# Patient Record
Sex: Female | Born: 1995 | Race: Black or African American | Hispanic: No | Marital: Single
Health system: Midwestern US, Community
[De-identification: ages and names within clinical notes are randomized; demographics above are authoritative.]

## PROBLEM LIST (undated history)

## (undated) ENCOUNTER — Inpatient Hospital Stay (HOSPITAL_COMMUNITY): Payer: Self-pay

## (undated) DIAGNOSIS — I10 Essential (primary) hypertension: Secondary | ICD-10-CM

## (undated) DIAGNOSIS — O139 Gestational [pregnancy-induced] hypertension without significant proteinuria, unspecified trimester: Secondary | ICD-10-CM

## (undated) HISTORY — PX: NO PAST SURGERIES: SHX2092

## (undated) HISTORY — PX: WISDOM TOOTH EXTRACTION: SHX21

---
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
## 2015-08-07 ENCOUNTER — Inpatient Hospital Stay
Admit: 2015-08-07 | Discharge: 2015-08-08 | Disposition: A | Discharge status: Home / Self Care | Payer: MEDICAID | Attending: Emergency Medicine

## 2015-08-07 DIAGNOSIS — L02211 Cutaneous abscess of abdominal wall: Secondary | ICD-10-CM

## 2015-08-07 LAB — CBC WITH AUTOMATED DIFF
ABS. BASOPHILS: 0 10*3/uL (ref 0.0–0.1)
ABS. EOSINOPHILS: 0 10*3/uL (ref 0.0–0.4)
ABS. LYMPHOCYTES: 2.2 10*3/uL (ref 0.8–3.5)
ABS. MONOCYTES: 0.9 10*3/uL (ref 0.0–1.0)
ABS. NEUTROPHILS: 11.7 10*3/uL — ABNORMAL HIGH (ref 1.8–8.0)
BASOPHILS: 0 % (ref 0–1)
EOSINOPHILS: 0 % (ref 0–7)
HCT: 37.2 % (ref 35.0–47.0)
HGB: 12.2 g/dL (ref 11.5–16.0)
LYMPHOCYTES: 15 % (ref 12–49)
MCH: 25.6 PG — ABNORMAL LOW (ref 26.0–34.0)
MCHC: 32.8 g/dL (ref 30.0–36.5)
MCV: 78.2 FL — ABNORMAL LOW (ref 80.0–99.0)
MONOCYTES: 6 % (ref 5–13)
NEUTROPHILS: 79 % — ABNORMAL HIGH (ref 32–75)
PLATELET: 176 10*3/uL (ref 150–400)
RBC: 4.76 M/uL (ref 3.80–5.20)
RDW: 16 % — ABNORMAL HIGH (ref 11.5–14.5)
WBC: 14.9 10*3/uL — ABNORMAL HIGH (ref 3.6–11.0)

## 2015-08-07 LAB — HCG URINE, QL. - POC: Pregnancy test,urine (POC): NEGATIVE

## 2015-08-07 MED ORDER — LIDOCAINE (PF) 10 MG/ML (1 %) IJ SOLN
10 mg/mL (1 %) | INTRAMUSCULAR | Status: DC
Start: 2015-08-07 — End: 2015-08-07

## 2015-08-07 MED ORDER — LIDOCAINE HCL 1 % (10 MG/ML) IJ SOLN
10 mg/mL (1 %) | Freq: Once | INTRAMUSCULAR | Status: AC
Start: 2015-08-07 — End: 2015-08-07
  Administered 2015-08-07: via SUBCUTANEOUS

## 2015-08-07 MED FILL — XYLOCAINE-MPF 10 MG/ML (1 %) INJECTION SOLUTION: 10 mg/mL (1 %) | INTRAMUSCULAR | Qty: 10

## 2015-08-07 NOTE — ED Notes (Signed)
Patient's visitor given apple juice.

## 2015-08-07 NOTE — ED Notes (Signed)
Dry dressing applied to patient's right upper abdominal quadrant.

## 2015-08-07 NOTE — Other (Signed)
Axis I   Depressive disorders recurrent  Axis II   No axis II diagnosis   Axis III   General medical conditions  Axis IV  Problems with primary support group and other psychosocial and environmental problems      Axis V  70-61 Some mild symptoms (e.g., depressed mood and mild insomnia) OR some difficulty in social, occupational, or school functioning (e.g., occasional truancy, or theft within the household), but generally functioning pretty well, has some meaningful relationships.  Comprehensive Assessment Form Part 1    Section I - Disposition      The Medical Doctor to Psychiatrist conference was not completed.  The Medical Doctor is in agreement with Psychiatrist disposition because patient is not seeking admission and contracts for safety if discharged..  The plan is patient is to be discharged and [facility] and her MHSS counselor.  The on-call Psychiatrist consulted was Dr. Latanya Maudlinahlvani.  The admitting Psychiatrist will not be applicable.  The admitting Diagnosis is NA.  The Payer source is [employer].  The name of the representative was NA.  This was not approved.     Section II - Integrated Summary  Summary:  [facility] is at bedside.  89 y[facility] with a small ulcer on her abdomen. Patient reported losing her baby in late March. She admits to an attempted pill overdose in 2015 that resulted in an admission to [facility]. Medical issues include obesity and HTN. [facility]. Patient is accompained by her "sister".  The patient is deemed competent to provide informed consent.  The information is given by the patient, relative(s) and past medical records.  The Chief Complaint is depression.  The Precipitant Factors are personal stressors.  Previous Hospitalizations: 2015  The patient has not previously been in restraints.  Current Psychiatrist and/or Case Manager is [facility], [facility] & private skills building counselor.    Lethality Assessment:     The potential for suicide is noted by the following: not noted.  The potential for homicide is not noted.  The patient has not been a perpetrator of sexual or physical abuse.  There are not pending charges.  The patient is not felt to be at risk for self harm or harm to others.  The attending nurse was advised that security has not been notified.    Section III - Psychosocial  The patient's overall mood and attitude is depressed.  Feelings of helplessness and hopelessness are not observed.  Generalized anxiety is observed by patient's report.  Panic is not observed. Phobias are not observed.  Obsessive compulsive tendencies are not observed.      Section IV - Mental Status Exam  The patient's appearance shows no evidence of impairment.  The patient's behavior is guarded. The patient is oriented to time, place, person and situation.  The patient's speech is soft.  The patient's mood  is depressed.  The range of affect shows no evidence of impairment.  The patient's thought content  demonstrates no evidence of impairment.  The thought process is circumstantial.  The patient's perception shows no evidence of impairment. The patient's memory shows no evidence of impairment.  The patient's appetite shows no evidence of impairment.  The patient's sleep shows no evidence of impairment. The patient shows little insight.  The patient's judgement is psychologically impaired.        Section V - Substance Abuse  The patient is using substances.  The patient is using  tobacco by inhalation for 1-5 years with last use on 08/07/15. The patient has not   experienced withdrawal symptoms.    Section VI - Living Arrangements  The patient is single.  The patient lives alone. The patient has no children.  The patient does plan to return home upon discharge.  The patient does not have legal issues pending. The patient's source of income comes from family.  Religious and cultural practices have not been voiced at this time.     The patient's greatest support comes from friends and this person will be involved with the treatment.    The patient has not been in an event described as horrible or outside the realm of ordinary life experience either currently or in the past.  The patient has not been a victim of sexual/physical abuse.    Section VII - Other Areas of Clinical Concern  The highest grade achieved is 12 th with the overall quality of school experience being described as good.  The patient is currently  unemployed and speaks [place] as a primary language.  The patient has no communication impairments affecting communication. The patient's preference for learning can be described as: can read and write adequately.  The patient's hearing is normal.  The patient's vision is normal .  Patient contracts for safety if discharged.    Claudie Leach

## 2015-08-07 NOTE — ED Notes (Signed)
Bedside and Verbal shift change report given to Philippa ChesterBrittney, Charity fundraiserN (Cabin crewoncoming nurse) by Preston FleetingHeather RN (offgoing nurse). Report included the following information SBAR.

## 2015-08-07 NOTE — ED Notes (Signed)
Assumed care of patient from waiting room.  Patient c/o abscess to right upper abdomen x 3 days, denies fever.      During psych assessment patient reported depression and not having medicaid to pay for her prescriptions, reports admission in 2015 to [facility] for same.  Denies plan, interested in resources.  Provider notified.  Patient identified support person at bedside.      Emergency Department Nursing Plan of Care       The Nursing Plan of Care is developed from the Nursing assessment and Emergency Department Attending provider initial evaluation.  The plan of care may be reviewed in the ???ED Provider note???.    The Plan of Care was developed with the following considerations:   Patient / Family readiness to learn indicated UJ:[id number]:verbalized understanding  Persons(s) to be included in education: patient  Barriers to Learning/Limitations:No    Signed     Orson ApeHeather H Mills, RN    08/07/2015   6:46 PM

## 2015-08-07 NOTE — ED Notes (Signed)
[facility] ([facility])  phoned in regards to consult. Annette StableBill informs he will be here shortly

## 2015-08-07 NOTE — ED Provider Notes (Signed)
HPI Comments: Pt reports had a miscarriage about a month and a half ago. She has a hx depression and is currently taking Zoloft. Pt reports zoloft is not helping and she is feeling increased sadness recently. Denies SI or HI. Pt expressed to friend about a month ago that she wanted to hurt herself but she did not have a plan. Pt was previously admitted to [facility] for depression two years ago. Pt sees a counselor multiple times weekly. Pt reports she has been sleeping less and has a decreased appetite.    Patient is a 20 y.o. female presenting with skin problem. The history is provided by the patient.   Skin Problem    This is a new problem. Episode onset: Pt reports redness, pain and swelling to RUQ x 3 days. Denies trauma/injury, draiange, warmth. No hx of abscesses. There has been no fever. Affected Location: RUQ. The pain is at a severity of 8/10. The pain is severe. Associated symptoms include pain. Pertinent negatives include no blisters, no itching, no weeping and no hives. She has tried nothing for the symptoms.        Past Medical History:   Diagnosis Date   ??? Psychiatric disorder        No past surgical history on file.      No family history on file.    Social History     Social History   ??? Marital status: SINGLE     Spouse name: N/A   ??? Number of children: N/A   ??? Years of education: N/A     Occupational History   ??? Not on file.     Social History Main Topics   ??? Smoking status: Current Every Day Smoker     Packs/day: 0.25   ??? Smokeless tobacco: Never Used   ??? Alcohol use Yes   ??? Drug use: Not on file   ??? Sexual activity: Yes     Partners: Male     Birth control/ protection: Injection     Other Topics Concern   ??? Not on file     Social History Narrative   ??? No narrative on file         ALLERGIES: Review of patient's allergies indicates no known allergies.    Review of Systems   Constitutional: Negative for chills and fever.   Respiratory: Negative for chest tightness and shortness of breath.     Cardiovascular: Negative for chest pain.   Gastrointestinal: Negative for abdominal pain, nausea and vomiting.   Genitourinary: Negative for flank pain.   Musculoskeletal: Negative for back pain and myalgias.   Skin: Positive for color change and rash. Negative for itching, pallor and wound.   Neurological: Negative for dizziness, weakness and light-headedness.   Psychiatric/Behavioral: Positive for sleep disturbance. Negative for agitation, behavioral problems, confusion, decreased concentration, dysphoric mood, hallucinations, self-injury and suicidal ideas. The patient is not nervous/anxious and is not hyperactive.    All other systems reviewed and are negative.      Vitals:    08/07/15 1834   BP: 142/68   Pulse: (!) 106   Resp: 18   Temp: 98.8 ??F (37.1 ??C)   SpO2: 100%   Weight: 107.5 kg (237 lb)   Height: 5\' 3"  (1.6 m)            Physical Exam   Constitutional: She is oriented to person, place, and time. She appears well-developed and well-nourished. No distress.   HENT:   Head: Normocephalic  and atraumatic.   Eyes: Conjunctivae are normal.   Cardiovascular: Normal rate, regular rhythm and normal heart sounds.    Pulmonary/Chest: Effort normal and breath sounds normal. No respiratory distress.   Abdominal: Soft. Normal appearance and bowel sounds are normal. She exhibits no distension. There is no tenderness.       Musculoskeletal: Normal range of motion.   Neurological: She is alert and oriented to person, place, and time. No cranial nerve deficit.   Skin: Skin is warm. No rash noted.   Psychiatric: She has a normal mood and affect. Her speech is normal and behavior is normal. Judgment and thought content normal. Cognition and memory are normal.   Pt tearful when talking about miscarriage.   Nursing note and vitals reviewed.       MDM  Number of Diagnoses or Management Options  Diagnosis management comments: DDx: Abscess, Cellulitis, Cyst, Depression, Sadness      ED Course       I&D Abcess Complex   Date/Time: 08/07/2015 7:55 PM  Performed by: Collier Flowers A  Authorized by: Collier Flowers A     Consent:     Consent obtained:  Verbal and written    Consent given by:  Patient    Risks discussed:  Bleeding, incomplete drainage, pain, infection and damage to other organs    Alternatives discussed:  No treatment  Location:     Type:  Abscess    Size:  3 cm    Location: RUQ.  Pre-procedure details:     Skin preparation:  Betadine and Chloraprep  Anesthesia (see MAR for exact dosages):     Anesthesia method:  Local infiltration    Local anesthetic:  Lidocaine 1% w/o epi  Procedure type:     Complexity:  Complex  Procedure details:     Incision types:  Single straight    Scalpel blade:  11    Wound management:  Irrigated with saline    Drainage:  Bloody    Drainage amount:  Scant    Wound treatment:  Wound left open    Packing materials:  1/4 in gauze    Amount 1/4":  5 cm  Post-procedure details:     Patient tolerance of procedure:  Tolerated well, no immediate complications              9:05 PM    I spoke with [employer]. He reports pt is switching to a new psychiatrist soon, and he feels this will be beneficial to her mental health progression. Pt has a great support system that is present. He feels pt is not a danger to herself or others, and she does not meet requirement for admission.   Joaquim Lai, PA           LABORATORY TESTS:  Recent Results (from the past 12 hour(s))   URINALYSIS W/ REFLEX CULTURE    Collection Time: 08/07/15  7:45 PM   Result Value Ref Range    Color YELLOW/STRAW      Appearance CLOUDY (A) CLEAR      Specific gravity 1.015 1.003 - 1.030      pH (UA) 7.0 5.0 - 8.0      Protein NEGATIVE  NEG mg/dL    Glucose NEGATIVE  NEG mg/dL    Ketone NEGATIVE  NEG mg/dL    Bilirubin NEGATIVE  NEG      Blood NEGATIVE  NEG      Urobilinogen 0.2 0.2 - 1.0 EU/dL  Nitrites NEGATIVE  NEG      Leukocyte Esterase SMALL (A) NEG      WBC 0-4 0 - 4 /hpf    RBC 0-5 0 - 5 /hpf     Epithelial cells MANY (A) FEW /lpf    Bacteria 1+ (A) NEG /hpf    UA:UC IF INDICATED URINE CULTURE ORDERED (A) CNI     ETHYL ALCOHOL    Collection Time: 08/07/15  7:45 PM   Result Value Ref Range    ALCOHOL(ETHYL),SERUM <10 <10 MG/DL   DRUG SCREEN, URINE    Collection Time: 08/07/15  7:45 PM   Result Value Ref Range    AMPHETAMINE NEGATIVE  NEG      BARBITURATES POSITIVE (A) NEG      BENZODIAZEPINE NEGATIVE  NEG      COCAINE NEGATIVE  NEG      METHADONE NEGATIVE  NEG      OPIATES NEGATIVE  NEG      PCP(PHENCYCLIDINE) NEGATIVE  NEG      THC (TH-CANNABINOL) NEGATIVE  NEG      Drug screen comment (NOTE)    CBC WITH AUTOMATED DIFF    Collection Time: 08/07/15  7:45 PM   Result Value Ref Range    WBC 14.9 (H) 3.6 - 11.0 K/uL    RBC 4.76 3.80 - 5.20 M/uL    HGB 12.2 11.5 - 16.0 g/dL    HCT 46.9 62.9 - 52.8 %    MCV 78.2 (L) 80.0 - 99.0 FL    MCH 25.6 (L) 26.0 - 34.0 PG    MCHC 32.8 30.0 - 36.5 g/dL    RDW 41.3 (H) 24.4 - 14.5 %    PLATELET 176 150 - 400 K/uL    NEUTROPHILS 79 (H) 32 - 75 %    LYMPHOCYTES 15 12 - 49 %    MONOCYTES 6 5 - 13 %    EOSINOPHILS 0 0 - 7 %    BASOPHILS 0 0 - 1 %    ABS. NEUTROPHILS 11.7 (H) 1.8 - 8.0 K/UL    ABS. LYMPHOCYTES 2.2 0.8 - 3.5 K/UL    ABS. MONOCYTES 0.9 0.0 - 1.0 K/UL    ABS. EOSINOPHILS 0.0 0.0 - 0.4 K/UL    ABS. BASOPHILS 0.0 0.0 - 0.1 K/UL   METABOLIC PANEL, BASIC    Collection Time: 08/07/15  7:45 PM   Result Value Ref Range    Sodium 143 136 - 145 mmol/L    Potassium 3.8 3.5 - 5.1 mmol/L    Chloride 108 97 - 108 mmol/L    CO2 27 21 - 32 mmol/L    Anion gap 8 5 - 15 mmol/L    Glucose 80 65 - 100 mg/dL    BUN 10 6 - 20 MG/DL    Creatinine 0.10 (H) 0.55 - 1.02 MG/DL    BUN/Creatinine ratio 10 (L) 12 - 20      GFR est AA >60 >60 ml/min/1.2m2    GFR est non-AA >60 >60 ml/min/1.84m2    Calcium 8.4 (L) 8.5 - 10.1 MG/DL   HCG URINE, QL. - POC    Collection Time: 08/07/15  7:50 PM   Result Value Ref Range    Pregnancy test,urine (POC) NEGATIVE  NEG         IMAGING RESULTS:   No orders to display       MEDICATIONS GIVEN:  Medications   lidocaine (XYLOCAINE) 10 mg/mL (1 %) injection 10 mL (10 mL SubCUTAneous  Given 08/07/15 1930)       IMPRESSION:  1. Abscess of skin of abdomen    2. Acute cystitis without hematuria    3. History of depression        PLAN:  1.   Discharge Medication List as of 08/07/2015  9:31 PM      START taking these medications    Details   trimethoprim-sulfamethoxazole (BACTRIM DS) 160-800 mg per tablet Take 1 Tab by mouth two (2) times a day for 7 days., Print, Disp-14 Tab, R-0      HYDROcodone-acetaminophen (NORCO) 5-325 mg per tablet Take 1 Tab by mouth every six (6) hours as needed for Pain. Max Daily Amount: 4 Tabs., Print, Disp-8 Tab, R-0           2.   Follow-up Information     Follow up With Details Comments Contact Info    [facility] EMERGENCY DEPT In 2 days For wound re-check [address] [place] [address]  [phone]    [facility] Schedule an appointment as soon as possible for a visit in 2 days for PCP follow up [address]  [place] [address]  [phone]        Return to ED if worse

## 2015-08-07 NOTE — Progress Notes (Signed)
RX Bactrim

## 2015-08-07 NOTE — ED Notes (Signed)
Patient educated on discharge instructions and two prescriptions by E. French Anaracy PA.      Emergency Department Nursing Plan of Care       The Nursing Plan of Care is developed from the Nursing assessment and Emergency Department Attending provider initial evaluation.  The plan of care may be reviewed in the ???ED Provider note???.    The Plan of Care was developed with the following considerations:   Patient / Family readiness to learn indicated ZO:XWRUEAVWUJby:verbalized understanding  Persons(s) to be included in education: patient  Barriers to Learning/Limitations:No    Signed     Sharlee BlewBrittney Santhiago Collingsworth, RN    08/07/2015   9:33 PM

## 2015-08-08 LAB — URINALYSIS W/ REFLEX CULTURE
Bilirubin: NEGATIVE
Blood: NEGATIVE
Glucose: NEGATIVE mg/dL
Ketone: NEGATIVE mg/dL
Nitrites: NEGATIVE
Protein: NEGATIVE mg/dL
Specific gravity: 1.015 (ref 1.003–1.030)
Urobilinogen: 0.2 EU/dL (ref 0.2–1.0)
pH (UA): 7 (ref 5.0–8.0)

## 2015-08-08 LAB — METABOLIC PANEL, BASIC
Anion gap: 8 mmol/L (ref 5–15)
BUN/Creatinine ratio: 10 — ABNORMAL LOW (ref 12–20)
BUN: 10 MG/DL (ref 6–20)
CO2: 27 mmol/L (ref 21–32)
Calcium: 8.4 MG/DL — ABNORMAL LOW (ref 8.5–10.1)
Chloride: 108 mmol/L (ref 97–108)
Creatinine: 1.05 MG/DL — ABNORMAL HIGH (ref 0.55–1.02)
GFR est AA: >60 mL/min/{1.73_m2} (ref >60)
GFR est non-AA: >60 mL/min/{1.73_m2} (ref >60)
Glucose: 80 mg/dL (ref 65–100)
Potassium: 3.8 mmol/L (ref 3.5–5.1)
Sodium: 143 mmol/L (ref 136–145)

## 2015-08-08 LAB — DRUG SCREEN, URINE
AMPHETAMINES: NEGATIVE
BARBITURATES: POSITIVE — AB
BENZODIAZEPINES: NEGATIVE
COCAINE: NEGATIVE
METHADONE: NEGATIVE
OPIATES: NEGATIVE
PCP(PHENCYCLIDINE): NEGATIVE
THC (TH-CANNABINOL): NEGATIVE

## 2015-08-08 LAB — ETHYL ALCOHOL: ALCOHOL(ETHYL),SERUM: <10 MG/DL (ref <10)

## 2015-08-08 MED ORDER — TRIMETHOPRIM-SULFAMETHOXAZOLE 160 MG-800 MG TAB
160-800 mg | ORAL_TABLET | Freq: Two times a day (BID) | ORAL | 0 refills | Status: AC
Start: 2015-08-08 — End: 2015-08-14

## 2015-08-08 MED ORDER — HYDROCODONE-ACETAMINOPHEN 5 MG-325 MG TAB
5-325 mg | ORAL_TABLET | Freq: Four times a day (QID) | ORAL | 0 refills | Status: AC | PRN
Start: 2015-08-08 — End: ?

## 2015-08-09 ENCOUNTER — Inpatient Hospital Stay
Admit: 2015-08-09 | Discharge: 2015-08-09 | Disposition: A | Discharge status: Home / Self Care | Payer: MEDICAID | Attending: Emergency Medicine

## 2015-08-09 DIAGNOSIS — Z5189 Encounter for other specified aftercare: Secondary | ICD-10-CM

## 2015-08-09 LAB — CULTURE, URINE
Colonies Counted: >100000
Colony Count: >100000

## 2015-08-09 NOTE — ED Notes (Signed)
Pt discharged by provider.

## 2015-08-09 NOTE — ED Notes (Signed)
Wound cleaned and dressing applied per providers orders.

## 2015-08-09 NOTE — ED Provider Notes (Signed)
Patient is a 20 y.o. female presenting with wound check.   Wound Check    Pertinent negatives include no back pain and no neck pain.      To ED for packing removal. Was here two days ago for I/D of R upper abdomen "cyst'.  Sts has done well. No fevers. Slight bloody drainage with dressing change. No new sx.  Was not able to get her RX filled. Plan to get filled Today.     Past Medical History:   Diagnosis Date   ??? Psychiatric disorder        History reviewed. No pertinent surgical history.      History reviewed. No pertinent family history.    Social History     Social History   ??? Marital status: SINGLE     Spouse name: N/A   ??? Number of children: N/A   ??? Years of education: N/A     Occupational History   ??? Not on file.     Social History Main Topics   ??? Smoking status: Current Every Day Smoker     Packs/day: 0.25   ??? Smokeless tobacco: Never Used   ??? Alcohol use Yes   ??? Drug use: Not on file   ??? Sexual activity: Yes     Partners: Male     Birth control/ protection: Injection     Other Topics Concern   ??? Not on file     Social History Narrative         ALLERGIES: Pear    Review of Systems   Constitutional: Negative for chills and fever.   HENT: Negative for congestion, rhinorrhea and sore throat.    Eyes: Negative for pain and discharge.   Respiratory: Negative for cough and shortness of breath.    Cardiovascular: Negative for chest pain.   Gastrointestinal: Negative for abdominal pain, nausea and vomiting.   Genitourinary: Negative for dysuria and urgency.   Musculoskeletal: Negative for back pain and neck pain.   Neurological: Negative for seizures, syncope and headaches.   All other systems reviewed and are negative.      Vitals:    08/09/15 1504   BP: (!) 149/91   Pulse: 92   Resp: 19   Temp: 99 ??F (37.2 ??C)   SpO2: 100%   Weight: 104.3 kg (230 lb)   Height: 5\' 3"  (1.6 m)            Physical Exam   Constitutional: She is oriented to person, place, and time. She appears well-developed and well-nourished.    obese   Eyes: Pupils are equal, round, and reactive to light.   Cardiovascular: Normal rate.    No murmur heard.  Pulmonary/Chest: Effort normal. She has no wheezes.   Abdominal: Soft. There is no tenderness. There is no rebound and no guarding.   Neurological: She is alert and oriented to person, place, and time.   Skin:   Right upper abdomen packing removed.   Wound open. No purulence. ozzed few drops blood.   Slight surrounding erythema. No tenderness.    Psychiatric: She has a normal mood and affect. Her behavior is normal.   Nursing note and vitals reviewed.       MDM  Number of Diagnoses or Management Options  Abscess packing removal:   Encounter for wound re-check:   Diagnosis management comments: Wound is open, so no need to repack.   No signs of re accumulation.   Encouraged her to fill the  abx, particularly given dx of UTI on last visit.   Local wound care.       ED Course       Procedures    LABORATORY TESTS:  No results found for this or any previous visit (from the past 12 hour(s)).    IMAGING RESULTS:  No orders to display       MEDICATIONS GIVEN:  Medications - No data to display    IMPRESSION:  1. Encounter for wound re-check    2. Abscess packing removal        PLAN:  1.   Discharge Medication List as of 08/09/2015  5:13 PM        2.   Follow-up Information     Follow up With Details Comments Contact Info    None   None ([phone]) Patient stated that they have no PCP      Carl Best   [address] [place] [address]  [phone]      As needed         Return to ED if worse

## 2015-08-09 NOTE — ED Notes (Signed)
Emergency Department Nursing Plan of Care       The Nursing Plan of Care is developed from the Nursing assessment and Emergency Department Attending provider initial evaluation.  The plan of care may be reviewed in the ???ED Provider note???.    The Plan of Care was developed with the following considerations:   Patient / Family readiness to learn indicated ZO:XWRUEAVWUJby:verbalized understanding  Persons(s) to be included in education: patient  Barriers to Learning/Limitations:No    Signed     Rolena InfanteKatherine L Guilford    08/09/2015   3:45 PM

## 2016-12-03 ENCOUNTER — Emergency Department (HOSPITAL_COMMUNITY)
Admission: EM | Admit: 2016-12-03 | Discharge: 2016-12-03 | Disposition: A | Discharge status: Home / Self Care | Payer: Self-pay | Attending: Emergency Medicine | Admitting: Emergency Medicine

## 2016-12-03 ENCOUNTER — Encounter (HOSPITAL_COMMUNITY): Payer: Self-pay | Admitting: Emergency Medicine

## 2016-12-03 DIAGNOSIS — I10 Essential (primary) hypertension: Secondary | ICD-10-CM | POA: Insufficient documentation

## 2016-12-03 DIAGNOSIS — Z4800 Encounter for change or removal of nonsurgical wound dressing: Secondary | ICD-10-CM | POA: Insufficient documentation

## 2016-12-03 DIAGNOSIS — L02415 Cutaneous abscess of right lower limb: Secondary | ICD-10-CM | POA: Insufficient documentation

## 2016-12-03 HISTORY — DX: Essential (primary) hypertension: I10

## 2016-12-03 NOTE — ED Triage Notes (Signed)
Per EMS- Patient had a cyst removed from her right inner thigh at [facility] 2 days ago. Patient states the area is still bleeding. EMS tried to give the patient bandage supplies, but refused and wanted the area checked out.

## 2016-12-03 NOTE — ED Provider Notes (Signed)
[facility]-EMERGENCY DEPT Provider Note   CSN: [id number] Arrival date & time: 12/03/16  1307     History   Chief Complaint Chief Complaint  Patient presents with  . Leg Pain  . s/p cyst removal    HPI Jamie Boone is a 21 y.o. female with a history of hypertension who presents emergency department today for wound recheck. The patient reports that she had a bump on her right upper leg that began approximately 4 days ago. She was seen at [facility] for this 2 days ago but denies this being at the emergency department or in the operating room. She states that she received an incision and drainage after local anesthesia and purulent drainage was removed with packing inserted. She was not given pain medication or antibiotics on discharge. She was told to follow-up in 2 days but she is unsure who she was supposed to follow-up with so she presents here today. The patient states that she also was not sure of how to clean or dress the wound as she only had 1 day's worth of dressings given at discharge. Since the I&D she is having sharp, stabbing, needlelike pain in the area that is constant and worse with lying down and when walking. She states that there is no purulent drainage discharge from the area but she does note that the packing is starting to come out and irritating her skin. She denies any spreading erythema, fever, chills, nausea, vomiting at home. There is no notes pertaining to the incision and drainage on chart review or in care everywhere.  HPI  Past Medical History:  Diagnosis Date  . Hypertension     There are no active problems to display for this patient.   History reviewed. No pertinent surgical history.  OB History    No data available       Home Medications    Prior to Admission medications   Not on File    Family History History reviewed. No pertinent family history.  Social History Social History  Substance Use Topics  . Smoking status: Never  Smoker  . Smokeless tobacco: Never Used  . Alcohol use No     Allergies   Percocet [oxycodone-acetaminophen]   Review of Systems Review of Systems  Constitutional: Negative for chills and fever.  Gastrointestinal: Negative for diarrhea, nausea and vomiting.  Skin: Positive for wound. Negative for color change.  Neurological: Negative for weakness and numbness.  All other systems reviewed and are negative.    Physical Exam Updated Vital Signs BP 133/82   Pulse 65   Temp 98 F (36.7 C) (Oral)   Resp 15   SpO2 99%   Physical Exam  Constitutional: She appears well-developed and well-nourished.  HENT:  Head: Normocephalic and atraumatic.  Right Ear: External ear normal.  Left Ear: External ear normal.  Eyes: Conjunctivae are normal. Right eye exhibits no discharge. Left eye exhibits no discharge. No scleral icterus.  Pulmonary/Chest: Effort normal. No respiratory distress.  Neurological: She is alert.  Skin: Skin is warm and dry. Capillary refill takes less than 2 seconds. No pallor.  Right upper leg: Appears to be 1.5cm incision with packing in place. No purulent drainage evident. There is surrounding 3cm area of induration without erythema surrounding the incision.   Psychiatric: She has a normal mood and affect.  Nursing note and vitals reviewed.    ED Treatments / Results  Labs (all labs ordered are listed, but only abnormal results are displayed) Labs Reviewed -  No data to display  EKG  EKG Interpretation None       Radiology No results found.  Procedures Procedures (including critical care time)   Medications Ordered in ED Medications - No data to display   Initial Impression / Assessment and Plan / ED Course  I have reviewed the triage vital signs and the nursing notes.  Pertinent labs & imaging results that were available during my care of the patient were reviewed by me and considered in my medical decision making (see chart for details).      21 year old female presenting for wound recheck. The patient had an I&D at [facility] 2 days ago for what appears to be a abscess. The patient was not placed on any antibiotics. Packing removed with only a mild to minimal amount of purulent drainage remaining. This was deloculated and irrigated. Wound redressed. Provided patient with home instructions including warm compresses and symptomatic treatment. Advised the patient states this occurred term precautions. Patient appears safe for discharge.  Final Clinical Impressions(s) / ED Diagnoses   Final diagnoses:  Change or removal of wound packing    New Prescriptions New Prescriptions   No medications on file     Princella Pellegrini 12/03/16 Rulon Abide, MD 12/04/16 [phone]

## 2016-12-03 NOTE — Discharge Instructions (Signed)
Please read and follow all provided instructions.  You were seen here today for an Abscess follow up. For this, an incision and drainage (aka an I&D) was previously preformed to the affected area. An I&D is a surgical procedure to open and drain a fluid-filled sac that may be filled with pus, mucus, or blood. Examples of fluid-filled sacs that may need surgical drainage include cysts, skin infections (abscesses), and red lumps that develop from a ruptured cyst or a small abscess (boils).  Home instructions  Treatment: Keep wound clean and dry. Apply warm compresses to throughout the day. It will continue to drain over the follow days.  For pain control you may take:  of ibuprofen (that is usually four  over the counter pills) up to 3 times a day (please take with food) and acetaminophen  (this is 3 normal strength, , over the counter pills) up to four times a day. Please do not take more than this. Do not drink alcohol or combine with other medications that have acetaminophen as an ingredient (Read the labels!).    Follow Up:  Follow-up with your Primary Care Provider this week.  Return to emergency department for emergent changing or worsening symptoms.  Return instructions:  Return to the Emergency Department if you have: Fever You have more redness, swelling, or pain around your incision.  Your incision feels warm to touch Redness of the skin that moves away from the affected area, especially if it streaks away from the affected area  The area where the incision and drainage occurred becomes numb or it tingles. Any other emergent concerns  Your vital signs today were: BP 133/82    Pulse 65    Temp 98 F (36.7 C) (Oral)    Resp 15    SpO2 99%  If your blood pressure (BP) was elevated above 135/85 this visit, please have this repeated by your doctor within one month. ---------------

## 2017-02-25 ENCOUNTER — Other Ambulatory Visit: Payer: Self-pay

## 2017-02-25 ENCOUNTER — Encounter (HOSPITAL_COMMUNITY): Payer: Self-pay | Admitting: *Deleted

## 2017-02-25 ENCOUNTER — Inpatient Hospital Stay (HOSPITAL_COMMUNITY): Payer: Medicaid Other

## 2017-02-25 ENCOUNTER — Inpatient Hospital Stay (HOSPITAL_COMMUNITY)
Admission: AD | Admit: 2017-02-25 | Discharge: 2017-02-25 | Disposition: A | Discharge status: Home / Self Care | Payer: Medicaid Other | Source: Ambulatory Visit | Attending: Family Medicine | Admitting: Family Medicine

## 2017-02-25 DIAGNOSIS — Z885 Allergy status to narcotic agent status: Secondary | ICD-10-CM | POA: Insufficient documentation

## 2017-02-25 DIAGNOSIS — Z3A01 Less than 8 weeks gestation of pregnancy: Secondary | ICD-10-CM | POA: Insufficient documentation

## 2017-02-25 DIAGNOSIS — O209 Hemorrhage in early pregnancy, unspecified: Secondary | ICD-10-CM

## 2017-02-25 DIAGNOSIS — R112 Nausea with vomiting, unspecified: Secondary | ICD-10-CM | POA: Diagnosis present

## 2017-02-25 DIAGNOSIS — O219 Vomiting of pregnancy, unspecified: Secondary | ICD-10-CM | POA: Diagnosis not present

## 2017-02-25 DIAGNOSIS — R109 Unspecified abdominal pain: Secondary | ICD-10-CM | POA: Diagnosis not present

## 2017-02-25 DIAGNOSIS — O210 Mild hyperemesis gravidarum: Secondary | ICD-10-CM | POA: Insufficient documentation

## 2017-02-25 DIAGNOSIS — O26891 Other specified pregnancy related conditions, first trimester: Secondary | ICD-10-CM | POA: Diagnosis not present

## 2017-02-25 DIAGNOSIS — Z3491 Encounter for supervision of normal pregnancy, unspecified, first trimester: Secondary | ICD-10-CM

## 2017-02-25 LAB — COMPREHENSIVE METABOLIC PANEL
ALBUMIN: 3.8 g/dL (ref 3.5–5.0)
ALK PHOS: 47 U/L (ref 38–126)
ALT: 14 U/L (ref 14–54)
ANION GAP: 9 (ref 5–15)
AST: 15 U/L (ref 15–41)
BILIRUBIN TOTAL: 0.4 mg/dL (ref 0.3–1.2)
BUN: 7 mg/dL (ref 6–20)
CALCIUM: 8.8 mg/dL — AB (ref 8.9–10.3)
CO2: 21 mmol/L — ABNORMAL LOW (ref 22–32)
Chloride: 107 mmol/L (ref 101–111)
Creatinine, Ser: 0.82 mg/dL (ref 0.44–1.00)
GLUCOSE: 74 mg/dL (ref 65–99)
POTASSIUM: 3.9 mmol/L (ref 3.5–5.1)
Sodium: 137 mmol/L (ref 135–145)
TOTAL PROTEIN: 7.2 g/dL (ref 6.5–8.1)

## 2017-02-25 LAB — CBC
HCT: 37.4 % (ref 36.0–46.0)
HEMOGLOBIN: 12.5 g/dL (ref 12.0–15.0)
MCH: 27.4 pg (ref 26.0–34.0)
MCHC: 33.4 g/dL (ref 30.0–36.0)
MCV: 82 fL (ref 78.0–100.0)
PLATELETS: 199 10*3/uL (ref 150–400)
RBC: 4.56 MIL/uL (ref 3.87–5.11)
RDW: 14 % (ref 11.5–15.5)
WBC: 12.4 10*3/uL — AB (ref 4.0–10.5)

## 2017-02-25 LAB — WET PREP, GENITAL
Sperm: NONE SEEN
TRICH WET PREP: NONE SEEN
YEAST WET PREP: NONE SEEN

## 2017-02-25 LAB — URINALYSIS, ROUTINE W REFLEX MICROSCOPIC
BACTERIA UA: NONE SEEN
Bilirubin Urine: NEGATIVE
Glucose, UA: NEGATIVE mg/dL
HGB URINE DIPSTICK: NEGATIVE
Ketones, ur: 20 mg/dL — AB
NITRITE: NEGATIVE
PROTEIN: 30 mg/dL — AB
Specific Gravity, Urine: 1.018 (ref 1.005–1.030)
pH: 5 (ref 5.0–8.0)

## 2017-02-25 LAB — ABO/RH: ABO/RH(D): A POS

## 2017-02-25 LAB — POCT PREGNANCY, URINE: PREG TEST UR: POSITIVE — AB

## 2017-02-25 LAB — HCG, QUANTITATIVE, PREGNANCY: HCG, BETA CHAIN, QUANT, S: 48455 m[IU]/mL — AB (ref <5)

## 2017-02-25 MED ORDER — PROMETHAZINE HCL 25 MG/ML IJ SOLN
25.0000 mg | Freq: Once | INTRAMUSCULAR | Status: AC
  Administered 2017-02-25: 25 mg via INTRAVENOUS
  Filled 2017-02-25: qty 1

## 2017-02-25 MED ORDER — ONDANSETRON HCL 4 MG/2ML IJ SOLN
4.0000 mg | Freq: Once | INTRAMUSCULAR | Status: AC
  Administered 2017-02-25: 4 mg via INTRAVENOUS
  Filled 2017-02-25: qty 2

## 2017-02-25 MED ORDER — PROMETHAZINE HCL 12.5 MG PO TABS: 12.5000 mg | ORAL_TABLET | Freq: Four times a day (QID) | ORAL | 0 refills | Status: DC | PRN

## 2017-02-25 NOTE — Progress Notes (Addendum)
G3P0. 2 + HPT. Presents to triage via EMS dt no car or ride. C/o medial  upper part of abd pain for past 3 days. Denies bleeding.   1522: provider at bs assessing. Pelvic exam, wet prep, gc done.  SVE closed  1535: IV started. Bag of LR with Phenergan up.   1539: pt to U/S taken by nurse tech via ambulatory.   1718: zofran IVPush given.   1800: crackers and drink provided.   1830: pt feeling much better. Tolerated PO challenge.  1846:D/c instructions given with pt understanding. Pt left unit via ambulatory.

## 2017-02-25 NOTE — MAU Provider Note (Signed)
History  CSN: [id number] Arrival date and time: 02/25/17 1356  First Provider Initiated Contact with Patient 02/25/17 1500      Chief Complaint  Patient presents with  . Nausea  . Emesis  . Possible Pregnancy  . Abdominal Pain    HPI: Jamie Boone is a 21 y.o. [id number] with positive pregnancy test and LMP in first week of Novermber who presents to maternity admissions with c/c of nausea and vomiting. Reports that she has has had nausea and vomiting for several days, and has not been able to keep anything down for over 24 hours. She reports that she has been tried gingerale and eating mostly bland food without help. She also reports that she has had abdominal cramping for a few days, generalized. Endorses vaginal bleeding, but states that she has some spotting about a week ago and this has since resolved. Denies any other concerns, including no abnormal vaginal discharge, vaginal itching, dysuria, hematuria, urinary frequency, HA, dizziness, or fever/chills.    Past obstetric history: OB History  Gravida Para Term Preterm AB Living  5       2 0  SAB TAB Ectopic Multiple Live Births               # Outcome Date GA Lbr Len/2nd Weight Sex Delivery Anes PTL Lv  5 Current           4 Gravida           3 Gravida           2 AB           1 AB               Past Medical History:  Diagnosis Date  . Hypertension    No past surgical history on file. Social History   Socioeconomic History  . Marital status: Single    Spouse name: Not on file  . Number of children: Not on file  . Years of education: Not on file  . Highest education level: Not on file  Social Needs  . Financial resource strain: Not on file  . Food insecurity - worry: Not on file  . Food insecurity - inability: Not on file  . Transportation needs - medical: Not on file  . Transportation needs - non-medical: Not on file  Occupational History  . Not on file  Tobacco Use  . Smoking status: Never Smoker  .  Smokeless tobacco: Never Used  Substance and Sexual Activity  . Alcohol use: No  . Drug use: Not on file  . Sexual activity: Not on file  Other Topics Concern  . Not on file  Social History Narrative  . Not on file   Allergies  Allergen Reactions  . Percocet [Oxycodone-Acetaminophen]     itching    No medications prior to admission.    I have reviewed patient's Past Medical Hx, Surgical Hx, Family Hx, Social Hx, medications and allergies.   Review of Systems - Negative except for what is mentioned in HPI.  Physical Exam   Blood pressure 132/80, pulse 71, temperature 98.5 F (36.9 C), temperature source Oral, resp. rate 16, weight 242 lb 4 oz (109.9 kg), last menstrual period 01/22/2017, SpO2 100 %, unknown if currently breastfeeding.  Constitutional: Well-developed, well-nourished female in no acute distress.  HENT: [place]/AT, mucous membranes are moist Eyes: normal conjunctivae, no scleral icterus Cardiovascular: normal rate Respiratory: normal effort GI: Abd soft, non-tender, non-distended  Pelvic: NEFG. Normal vaginal mucosa  without lesions; cervix pink, visually closed, without lesion; scant discharge; no blood Cervix closed/long, firm, neg CMT, uterus nontender, nonenlarged, adnexa without tenderness, enlargement, or mass MSK: Extremities nontender, no edema Neurologic: Alert and oriented x 4. Psych: Normal mood and affect Skin: warm and dry  MAU Course/MDM:   Nursing notes and VS reviewed. Patient seen and examined, as noted above.  IV fluids, IV phenergan ordered for N/V.  Cultures collected to r/o pelvic infection Labs ordered: Quant HCG, CBC, ABO/Rh Ultrasound to r/o ectopic.  Urine sent for culture.  Results reviewed:  Results for orders placed or performed during the hospital encounter of 02/25/17  Wet prep, genital  Result Value Ref Range   Yeast Wet Prep HPF POC NONE SEEN NONE SEEN   Trich, Wet Prep NONE SEEN NONE SEEN   Clue Cells Wet Prep HPF POC  PRESENT (A) NONE SEEN   WBC, Wet Prep HPF POC FEW (A) NONE SEEN   Sperm NONE SEEN   Urinalysis, Routine w reflex microscopic  Result Value Ref Range   Color, Urine YELLOW YELLOW   APPearance HAZY (A) CLEAR   Specific Gravity, Urine 1.018 1.005 - 1.030   pH 5.0 5.0 - 8.0   Glucose, UA NEGATIVE NEGATIVE mg/dL   Hgb urine dipstick NEGATIVE NEGATIVE   Bilirubin Urine NEGATIVE NEGATIVE   Ketones, ur 20 (A) NEGATIVE mg/dL   Protein, ur 30 (A) NEGATIVE mg/dL   Nitrite NEGATIVE NEGATIVE   Leukocytes, UA MODERATE (A) NEGATIVE   RBC / HPF 0-5 0 - 5 RBC/hpf   WBC, UA 6-30 0 - 5 WBC/hpf   Bacteria, UA NONE SEEN NONE SEEN   Squamous Epithelial / LPF 6-30 (A) NONE SEEN   Mucus PRESENT   CBC  Result Value Ref Range   WBC 12.4 (H) 4.0 - 10.5 K/uL   RBC 4.56 3.87 - 5.11 MIL/uL   Hemoglobin 12.5 12.0 - 15.0 g/dL   HCT 16.1 09.6 - 04.5 %   MCV 82.0 78.0 - 100.0 fL   MCH 27.4 26.0 - 34.0 pg   MCHC 33.4 30.0 - 36.0 g/dL   RDW 40.9 81.1 - 91.4 %   Platelets 199 150 - 400 K/uL  Comprehensive metabolic panel  Result Value Ref Range   Sodium 137 135 - 145 mmol/L   Potassium 3.9 3.5 - 5.1 mmol/L   Chloride 107 101 - 111 mmol/L   CO2 21 (L) 22 - 32 mmol/L   Glucose, Bld 74 65 - 99 mg/dL   BUN 7 6 - 20 mg/dL   Creatinine, Ser 7.82 0.44 - 1.00 mg/dL   Calcium 8.8 (L) 8.9 - 10.3 mg/dL   Total Protein 7.2 6.5 - 8.1 g/dL   Albumin 3.8 3.5 - 5.0 g/dL   AST 15 15 - 41 U/L   ALT 14 14 - 54 U/L   Alkaline Phosphatase 47 38 - 126 U/L   Total Bilirubin 0.4 0.3 - 1.2 mg/dL   GFR calc non Af Amer >60 >60 mL/min   GFR calc Af Amer >60 >60 mL/min   Anion gap 9 5 - 15  Pregnancy, urine POC  Result Value Ref Range   Preg Test, Ur POSITIVE (A) NEGATIVE  ABO/Rh  Result Value Ref Range   ABO/RH(D) A POS     US OB Comp Less 14 Wks CLINICAL DATA:  Pregnant patient with lower abdominal pain.  EXAM: OBSTETRIC <14 WK [place] AND TRANSVAGINAL OB US  TECHNIQUE: Both transabdominal and transvaginal ultrasound  examinations were  performed for complete evaluation of the gestation as well as the maternal uterus, adnexal regions, and pelvic cul-de-sac. Transvaginal technique was performed to assess early pregnancy.  COMPARISON:  CT abdomen pelvis 02/19/2016  FINDINGS: Intrauterine gestational sac: Single  Yolk sac:  Visualized. Embryo:  Visualized. Cardiac Activity: Visualized. Heart Rate: 116  bpm CRL:  6  mm   6 w   2 d                  US EDC: 10/19/2017  Subchorionic hemorrhage:  None visualized.  Maternal uterus/adnexae: Corpus luteum within the right ovary. Normal left ovary. Trace free fluid in the pelvis.  IMPRESSION: Single live intrauterine gestation.  No subchorionic hemorrhage.  Electronically Signed   By: Annia Beltrew  Davis M.D.   On: 02/25/2017 16:17  --U/S shows IUP --Electrolytes wnl. Pt feeling better with meds and fluids.  --Po trial   Assessment and Plan  Assessment: 1. Nausea/vomiting in pregnancy   2. Vaginal bleeding in pregnancy, first trimester   3. Abdominal pain during pregnancy in first trimester   4. Normal IUP (intrauterine pregnancy) on prenatal ultrasound, first trimester     Plan: --Discharge home in stable condition.  --Discussed management of nausea, vomiting in pregnancy. Start Vitamin B6 OTC, add Unisom if need. Rx for phenergan prn. Encouraged small frequent meals and drinking a lot of water.  --Encouraged to return here or to other Urgent Care/ED if she develops worsening of symptoms, increase in pain, fever, or other concerning symptoms.    Degele, Kandra NicolasJulie P, MD 02/25/2017 3:29 PM

## 2017-02-25 NOTE — MAU Note (Signed)
Pt arrived by EMS. Has been cramping for a couple days now and every time she eats something- she throws it up. Has tried crackers, bread, gingerale, juice,fruit, nothing works.  +HPT last Wed.

## 2017-02-25 NOTE — Discharge Instructions (Signed)
--  For nausea and vomiting, start taking Vitamin B6 3 times a day --Add doxylamine (Unisom) 1 tab at bedtime if symptoms are not improving --Rx also given for phenergan to be taken as needed for unrelieved symptoms --Eat small frequent meals, avoid fatty foods and sugary foods and drinks.   Morning Sickness Morning sickness is when you feel sick to your stomach (nauseous) during pregnancy. You may feel sick to your stomach and throw up (vomit). You may feel sick in the morning, but you can feel this way any time of day. Some women feel very sick to their stomach and cannot stop throwing up (hyperemesis gravidarum). Follow these instructions at home:  Only take medicines as told by your doctor.  Take multivitamins as told by your doctor. Taking multivitamins before getting pregnant can stop or lessen the harshness of morning sickness.  Eat dry toast or unsalted crackers before getting out of bed.  Eat 5 to 6 small meals a day.  Eat dry and bland foods like rice and baked potatoes.  Do not drink liquids with meals. Drink between meals.  Do not eat greasy, fatty, or spicy foods.  Have someone cook for you if the smell of food causes you to feel sick or throw up.  If you feel sick to your stomach after taking prenatal vitamins, take them at night or with a snack.  Eat protein when you need a snack (nuts, yogurt, cheese).  Eat unsweetened gelatins for dessert.  Wear a bracelet used for sea sickness (acupressure wristband).  Go to a doctor that puts thin needles into certain body points (acupuncture) to improve how you feel.  Do not smoke.  Use a humidifier to keep the air in your house free of odors.  Get lots of fresh air. Contact a doctor if:  You need medicine to feel better.  You feel dizzy or lightheaded.  You are losing weight. Get help right away if:  You feel very sick to your stomach and cannot stop throwing up.  You pass out (faint). This information is not  intended to replace advice given to you by your health care provider. Make sure you discuss any questions you have with your health care provider. Document Released: 04/12/2004 Document Revised: 08/11/2015 Document Reviewed: 08/20/2012 Elsevier Interactive Patient Education  2017 Elsevier Inc.  [place] [facility] Providers    [facility]       Phone: [phone]  [facility] [place] Phone: [phone]  [facility] [place]  Phone: [phone]  [facility]  Phone: [phone]  [facility] [place]  Phone: [phone]  [facility]       Phone: [phone]  [facility]    Phone: [phone]   [facility]    Phone: [phone]  [facility]    Phone: [phone]  [facility]    Phone: [phone]  [facility]    Phone: [phone]  [facility]       Phone: [phone]   [facility]  Phone: [phone]  [facility]    Phone: [phone]  [facility] [place]   Phone: [phone]  [facility]      Phone: [phone]  [facility]    Phone: [phone]

## 2017-02-26 LAB — CULTURE, OB URINE: Culture: NO GROWTH

## 2017-02-26 LAB — GC/CHLAMYDIA PROBE AMP (~~LOC~~) NOT AT ARMC
Chlamydia: POSITIVE — AB
NEISSERIA GONORRHEA: NEGATIVE

## 2017-02-27 ENCOUNTER — Telehealth: Payer: Self-pay | Admitting: Medical

## 2017-02-27 DIAGNOSIS — O98811 Other maternal infectious and parasitic diseases complicating pregnancy, first trimester: Principal | ICD-10-CM

## 2017-02-27 DIAGNOSIS — A749 Chlamydial infection, unspecified: Secondary | ICD-10-CM

## 2017-02-27 MED ORDER — AZITHROMYCIN 250 MG PO TABS: 1000.0000 mg | ORAL_TABLET | Freq: Once | ORAL | 0 refills | Status: AC

## 2017-02-27 NOTE — Telephone Encounter (Addendum)
Chonita Wanke tested positive for  Chlamydia. Patient was called by RN and allergies and pharmacy confirmed. Rx sent to pharmacy of choice.   Marny LowensteinWenzel, Julie N, PA-C 02/27/2017 1:28 PM      ----- Message from Kathe BectonLori S Berdik, RN sent at 02/27/2017 12:55 PM EST ----- This patient tested positive for: chlamydia  She is allergic to "Percocet" I have informed the patient of her results and confirmed her pharmacy is correct in her chart. Please send Rx.   Thank you,   Kathe BectonBerdik, Lori S, RN   Results fax[facility] to [facility].

## 2017-03-20 ENCOUNTER — Encounter (HOSPITAL_COMMUNITY): Payer: Self-pay | Admitting: *Deleted

## 2017-03-20 ENCOUNTER — Inpatient Hospital Stay (HOSPITAL_COMMUNITY)
Admission: AD | Admit: 2017-03-20 | Discharge: 2017-03-21 | Disposition: A | Discharge status: Home / Self Care | Payer: Medicaid Other | Source: Ambulatory Visit | Attending: Obstetrics and Gynecology | Admitting: Obstetrics and Gynecology

## 2017-03-20 DIAGNOSIS — R51 Headache: Secondary | ICD-10-CM | POA: Diagnosis present

## 2017-03-20 DIAGNOSIS — O218 Other vomiting complicating pregnancy: Secondary | ICD-10-CM | POA: Insufficient documentation

## 2017-03-20 DIAGNOSIS — O26892 Other specified pregnancy related conditions, second trimester: Secondary | ICD-10-CM | POA: Diagnosis present

## 2017-03-20 DIAGNOSIS — O219 Vomiting of pregnancy, unspecified: Secondary | ICD-10-CM | POA: Diagnosis not present

## 2017-03-20 DIAGNOSIS — Z3A09 9 weeks gestation of pregnancy: Secondary | ICD-10-CM | POA: Insufficient documentation

## 2017-03-20 DIAGNOSIS — Z87891 Personal history of nicotine dependence: Secondary | ICD-10-CM | POA: Diagnosis not present

## 2017-03-20 HISTORY — DX: Gestational (pregnancy-induced) hypertension without significant proteinuria, unspecified trimester: O13.9

## 2017-03-20 LAB — URINALYSIS, ROUTINE W REFLEX MICROSCOPIC
Bacteria, UA: NONE SEEN
Bilirubin Urine: NEGATIVE
Glucose, UA: 50 mg/dL — AB
Hgb urine dipstick: NEGATIVE
Ketones, ur: 20 mg/dL — AB
Leukocytes, UA: NEGATIVE
Nitrite: NEGATIVE
Protein, ur: 30 mg/dL — AB
Specific Gravity, Urine: 1.015 (ref 1.005–1.030)
pH: 5 (ref 5.0–8.0)

## 2017-03-20 MED ORDER — LACTATED RINGERS IV BOLUS (SEPSIS)
1000.0000 mL | Freq: Once | INTRAVENOUS | Status: AC
  Administered 2017-03-20: 1000 mL via INTRAVENOUS

## 2017-03-20 MED ORDER — DIPHENHYDRAMINE HCL 50 MG/ML IJ SOLN
25.0000 mg | Freq: Once | INTRAMUSCULAR | Status: AC
  Administered 2017-03-20: 25 mg via INTRAVENOUS
  Filled 2017-03-20: qty 1

## 2017-03-20 MED ORDER — DEXAMETHASONE SODIUM PHOSPHATE 10 MG/ML IJ SOLN
10.0000 mg | Freq: Once | INTRAMUSCULAR | Status: AC
  Administered 2017-03-20: 10 mg via INTRAVENOUS
  Filled 2017-03-20: qty 1

## 2017-03-20 MED ORDER — METOCLOPRAMIDE HCL 5 MG/ML IJ SOLN
10.0000 mg | Freq: Once | INTRAMUSCULAR | Status: AC
  Administered 2017-03-20: 10 mg via INTRAVENOUS
  Filled 2017-03-20: qty 2

## 2017-03-20 NOTE — MAU Note (Signed)
Pt arrived via EMS with c/o Nausea and vomiting; was started on phenergan but says it makes her sleepy and it doesn't stop the vomiting, so she stopped taking it. Today reports vomiting sm streaks of blood. Denies any diarrhea, abd pain or vag bleeding.

## 2017-03-20 NOTE — MAU Provider Note (Signed)
Chief Complaint: Nausea and Headache   First Provider Initiated Contact with Patient 03/20/17 2301      SUBJECTIVE HPI: Jamie Boone is a 22 y.o. G2P0010 at 4476w4d by LMP who presents to maternity admissions via EMS reporting nausea and vomiting. She reports that she has "been sick all day, not able to keep anything down, does not remember how many times she has thrown up but it has been a lot". She reports that the last time she vomited it was stomach fluid mixed with blood from vomiting so much. She is currently prescribed Phenergan and Vitamin B6 with Unisom with little relief from medication. She has not taken Phenergan today states it "didn't help so she stopped taking it". Associated symptoms include HA that started this afternoon from not eating- did not take anything for HA because she "did not know what was safe to take". She denies abdominal pain or cramping. She denies vaginal bleeding, vaginal itching/burning, urinary symptoms,dizziness, n/v, or fever/chills.     Past Medical History:  Diagnosis Date  . Hypertension   . Pregnancy induced hypertension    Past Surgical History:  Procedure Laterality Date  . NO PAST SURGERIES    . WISDOM TOOTH EXTRACTION     Social History   Socioeconomic History  . Marital status: Single    Spouse name: Not on file  . Number of children: Not on file  . Years of education: Not on file  . Highest education level: Not on file  Social Needs  . Financial resource strain: Not on file  . Food insecurity - worry: Not on file  . Food insecurity - inability: Not on file  . Transportation needs - medical: Not on file  . Transportation needs - non-medical: Not on file  Occupational History  . Not on file  Tobacco Use  . Smoking status: Former Smoker    Last attempt to quit: 01/18/2017    Years since quitting: 0.1  . Smokeless tobacco: Never Used  Substance and Sexual Activity  . Alcohol use: No  . Drug use: No  . Sexual activity: Yes  Other  Topics Concern  . Not on file  Social History Narrative  . Not on file   No current facility-administered medications on file prior to encounter.    Current Outpatient Medications on File Prior to Encounter  Medication Sig Dispense Refill  . promethazine (PHENERGAN) 12.5 MG tablet Take 1 tablet (12.5 mg total) by mouth every 6 (six) hours as needed for nausea or vomiting. 20 tablet 0   Allergies  Allergen Reactions  . Percocet [Oxycodone-Acetaminophen]     itching    ROS:  Review of Systems  Constitutional: Negative.   Respiratory: Negative.   Cardiovascular: Negative.   Gastrointestinal: Positive for nausea and vomiting. Negative for abdominal pain, constipation and diarrhea.  Genitourinary: Negative.   Musculoskeletal: Negative.   Neurological: Positive for headaches.  Psychiatric/Behavioral: Negative.     I have reviewed patient's Past Medical Hx, Surgical Hx, Family Hx, Social Hx, medications and allergies.   Physical Exam   Patient Vitals for the past 24 hrs:  BP Temp Temp src Pulse Resp Height Weight  03/20/17 2255 129/73 99 F (37.2 C) Oral 76 16 5\' 3"  (1.6 m) 243 lb (110.2 kg)   Constitutional: Well-developed, well-nourished female in no acute distress.  Cardiovascular: normal rate Respiratory: normal effort GI: Abd soft, non-tender. Pos BS x 4 MS: Extremities nontender, no edema, normal ROM Neurologic: Alert and oriented x 4.  GU: Neg CVAT. Mouth: Mucous membranes dry   LAB RESULTS Results for orders placed or performed during the hospital encounter of 03/20/17 (from the past 24 hour(s))  Urinalysis, Routine w reflex microscopic     Status: Abnormal   Collection Time: 03/20/17 10:30 PM  Result Value Ref Range   Color, Urine YELLOW YELLOW   APPearance HAZY (A) CLEAR   Specific Gravity, Urine 1.015 1.005 - 1.030   pH 5.0 5.0 - 8.0   Glucose, UA 50 (A) NEGATIVE mg/dL   Hgb urine dipstick NEGATIVE NEGATIVE   Bilirubin Urine NEGATIVE NEGATIVE   Ketones,  ur 20 (A) NEGATIVE mg/dL   Protein, ur 30 (A) NEGATIVE mg/dL   Nitrite NEGATIVE NEGATIVE   Leukocytes, UA NEGATIVE NEGATIVE   RBC / HPF 0-5 0 - 5 RBC/hpf   WBC, UA 6-30 0 - 5 WBC/hpf   Bacteria, UA NONE SEEN NONE SEEN   Squamous Epithelial / LPF 0-5 (A) NONE SEEN   Mucus PRESENT     --/--/A POS (12/10 1647)  MAU Management/MDM: Orders Placed This Encounter  Procedures  . Urinalysis, Routine w reflex microscopic  . Insert peripheral IV  . Discharge patient Discharge disposition: 01-Home or Self Care; Discharge patient date: 03/21/2017    Meds ordered this encounter  Medications  . lactated ringers bolus 1,000 mL  . AND Linked Order Group   . diphenhydrAMINE (BENADRYL) injection 25 mg   . metoCLOPramide (REGLAN) injection 10 mg   . dexamethasone (DECADRON) injection 10 mg  . metoCLOPramide (REGLAN) 10 MG tablet    Sig: Take 1 tablet (10 mg total) by mouth daily before breakfast.    Dispense:  30 tablet    Refill:  0    Order Specific Question:   Supervising Provider    Answer:   Tilda Burrow [[id number]]   Treatments in MAU included 1,000 mL LR and headache cocktail- relief of N/V and HA with cocktail, able to keep crackers down while in MAU. Pt discharged.   ASSESSMENT 1. Nausea and vomiting during pregnancy prior to [redacted] weeks gestation     PLAN Discharge home Rx for Reglan to be taken in the morning Continue phenergan as needed  Follow up as scheduled with initial visit on 1/17 at HD  Return to MAU as needed for emergencies    Allergies as of 03/21/2017      Reactions   Percocet [oxycodone-acetaminophen]    itching      Medication List    TAKE these medications   metoCLOPramide 10 MG tablet Commonly known as:  REGLAN Take 1 tablet (10 mg total) by mouth daily before breakfast.   promethazine 12.5 MG tablet Commonly known as:  PHENERGAN Take 1 tablet (12.5 mg total) by mouth every 6 (six) hours as needed for nausea or vomiting.       Steward Drone   Certified Nurse-Midwife 03/20/2017  11:19 PM

## 2017-03-21 DIAGNOSIS — O219 Vomiting of pregnancy, unspecified: Secondary | ICD-10-CM

## 2017-03-21 MED ORDER — METOCLOPRAMIDE HCL 10 MG PO TABS: 10.0000 mg | ORAL_TABLET | Freq: Every day | ORAL | 0 refills | Status: DC

## 2017-03-21 NOTE — Discharge Instructions (Signed)

## 2017-08-29 ENCOUNTER — Other Ambulatory Visit: Payer: Self-pay

## 2017-08-29 ENCOUNTER — Encounter (HOSPITAL_COMMUNITY): Payer: Self-pay

## 2017-08-29 ENCOUNTER — Inpatient Hospital Stay (HOSPITAL_COMMUNITY)
Admission: AD | Admit: 2017-08-29 | Discharge: 2017-08-29 | Disposition: A | Discharge status: Home / Self Care | Payer: Medicaid Other | Source: Ambulatory Visit | Attending: Obstetrics and Gynecology | Admitting: Obstetrics and Gynecology

## 2017-08-29 DIAGNOSIS — O163 Unspecified maternal hypertension, third trimester: Secondary | ICD-10-CM | POA: Insufficient documentation

## 2017-08-29 DIAGNOSIS — Z3A32 32 weeks gestation of pregnancy: Secondary | ICD-10-CM | POA: Insufficient documentation

## 2017-08-29 DIAGNOSIS — K219 Gastro-esophageal reflux disease without esophagitis: Secondary | ICD-10-CM | POA: Insufficient documentation

## 2017-08-29 DIAGNOSIS — O99513 Diseases of the respiratory system complicating pregnancy, third trimester: Secondary | ICD-10-CM | POA: Insufficient documentation

## 2017-08-29 DIAGNOSIS — O210 Mild hyperemesis gravidarum: Secondary | ICD-10-CM | POA: Diagnosis not present

## 2017-08-29 DIAGNOSIS — O99613 Diseases of the digestive system complicating pregnancy, third trimester: Secondary | ICD-10-CM | POA: Insufficient documentation

## 2017-08-29 DIAGNOSIS — Z79899 Other long term (current) drug therapy: Secondary | ICD-10-CM | POA: Insufficient documentation

## 2017-08-29 DIAGNOSIS — J029 Acute pharyngitis, unspecified: Secondary | ICD-10-CM | POA: Insufficient documentation

## 2017-08-29 DIAGNOSIS — O219 Vomiting of pregnancy, unspecified: Secondary | ICD-10-CM | POA: Diagnosis not present

## 2017-08-29 DIAGNOSIS — R112 Nausea with vomiting, unspecified: Secondary | ICD-10-CM | POA: Diagnosis present

## 2017-08-29 DIAGNOSIS — Z885 Allergy status to narcotic agent status: Secondary | ICD-10-CM | POA: Diagnosis not present

## 2017-08-29 DIAGNOSIS — Z87891 Personal history of nicotine dependence: Secondary | ICD-10-CM | POA: Diagnosis not present

## 2017-08-29 LAB — URINALYSIS, ROUTINE W REFLEX MICROSCOPIC
Bilirubin Urine: NEGATIVE
GLUCOSE, UA: 50 mg/dL — AB
Hgb urine dipstick: NEGATIVE
KETONES UR: 5 mg/dL — AB
Nitrite: NEGATIVE
PROTEIN: NEGATIVE mg/dL
Specific Gravity, Urine: 1.012 (ref 1.005–1.030)
pH: 6 (ref 5.0–8.0)

## 2017-08-29 MED ORDER — RANITIDINE HCL 150 MG PO TABS: 150.0000 mg | ORAL_TABLET | Freq: Two times a day (BID) | ORAL | 0 refills | Status: DC

## 2017-08-29 MED ORDER — ONDANSETRON 8 MG PO TBDP: 8.0000 mg | ORAL_TABLET | Freq: Three times a day (TID) | ORAL | 0 refills | Status: DC | PRN

## 2017-08-29 MED ORDER — ONDANSETRON HCL 40 MG/20ML IJ SOLN
8.0000 mg | Freq: Once | INTRAMUSCULAR | Status: AC
  Administered 2017-08-29: 8 mg via INTRAVENOUS
  Filled 2017-08-29: qty 4

## 2017-08-29 MED ORDER — LACTATED RINGERS IV BOLUS
1000.0000 mL | Freq: Once | INTRAVENOUS | Status: AC
  Administered 2017-08-29: 1000 mL via INTRAVENOUS

## 2017-08-29 MED ORDER — FAMOTIDINE IN NACL 20-0.9 MG/50ML-% IV SOLN
20.0000 mg | Freq: Once | INTRAVENOUS | Status: AC
  Administered 2017-08-29: 20 mg via INTRAVENOUS
  Filled 2017-08-29: qty 50

## 2017-08-29 NOTE — MAU Note (Signed)
Past 3 days has been vomiting, can't keep anything down- not fluids or food.  Is having burning in throat and upper chest, doesn't want to drink anything because of it.  Lower abd sometimes hurts when she throws up. Has not taken anything for it

## 2017-08-29 NOTE — MAU Note (Signed)
EMS report:  Vomiting x3; ran out of zofran.  Vomited prior to ems arrival.  Non while with ems. 7 months preg, G2 p 0. little bit of pain in the throat

## 2017-08-29 NOTE — MAU Provider Note (Signed)
History     CSN: [id number]  Arrival date and time: 08/29/17 1438   First Provider Initiated Contact with Patient 08/29/17 1523      Chief Complaint  Patient presents with  . Emesis  . Sore Throat   Emesis   This is a recurrent problem. The current episode started more than 1 year ago. The problem occurs 2 to 4 times per day. The problem has been gradually worsening. There has been no fever. Pertinent negatives include no abdominal pain (No pain currently ), diarrhea or fever. She has tried nothing for the symptoms.  Sore Throat   Associated symptoms include vomiting. Pertinent negatives include no abdominal pain (No pain currently ) or diarrhea.    [name] is a 22 y.o. female G2P0010 @ 1558w5d [place] Has had hyperemesis throughout the pregnancy, taking Zofran. Says she forgot her Zofran at home and has been vomiting for 3 days straight. She is unable to keep anything down. She did not have any other medications at home to take. No diarrhea. No fever.  Sore throat from vomiting.   OB History    Gravida  2   Para      Term      Preterm      AB  1   Living  0     SAB  1   TAB      Ectopic      Multiple      Live Births              Past Medical History:  Diagnosis Date  . Hypertension   . Pregnancy induced hypertension     Past Surgical History:  Procedure Laterality Date  . NO PAST SURGERIES    . WISDOM TOOTH EXTRACTION      History reviewed. No pertinent family history.  Social History   Tobacco Use  . Smoking status: Former Smoker    Last attempt to quit: 01/18/2017    Years since quitting: 0.6  . Smokeless tobacco: Never Used  Substance Use Topics  . Alcohol use: No  . Drug use: No    Allergies:  Allergies  Allergen Reactions  . Percocet [Oxycodone-Acetaminophen]     itching    Medications Prior to Admission  Medication Sig Dispense Refill Last Dose  . metoCLOPramide (REGLAN) 10 MG tablet Take 1 tablet (10  mg total) by mouth daily before breakfast. 30 tablet 0   . promethazine (PHENERGAN) 12.5 MG tablet Take 1 tablet (12.5 mg total) by mouth every 6 (six) hours as needed for nausea or vomiting. 20 tablet 0 03/19/2017 at Unknown time   Results for orders placed or performed during the hospital encounter of 08/29/17 (from the past 48 hour(s))  Urinalysis, Routine w reflex microscopic     Status: Abnormal   Collection Time: 08/29/17  2:59 PM  Result Value Ref Range   Color, Urine YELLOW YELLOW   APPearance HAZY (A) CLEAR   Specific Gravity, Urine 1.012 1.005 - 1.030   pH 6.0 5.0 - 8.0   Glucose, UA 50 (A) NEGATIVE mg/dL   Hgb urine dipstick NEGATIVE NEGATIVE   Bilirubin Urine NEGATIVE NEGATIVE   Ketones, ur 5 (A) NEGATIVE mg/dL   Protein, ur NEGATIVE NEGATIVE mg/dL   Nitrite NEGATIVE NEGATIVE   Leukocytes, UA LARGE (A) NEGATIVE   RBC / HPF 0-5 0 - 5 RBC/hpf   WBC, UA 0-5 0 - 5 WBC/hpf   Bacteria, UA RARE (A) NONE SEEN  Squamous Epithelial / LPF 6-10 0 - 5   Mucus PRESENT     Comment: Performed at [facility], [address], [place], [place] [address]   Review of Systems  Constitutional: Negative for fever.  Gastrointestinal: Positive for nausea and vomiting. Negative for abdominal pain (No pain currently ), constipation and diarrhea.  Genitourinary: Negative for vaginal bleeding and vaginal discharge.   Physical Exam   Blood pressure 136/73, pulse (!) 111, temperature 98.4 F (36.9 C), temperature source Oral, resp. rate 18, height 5\' 3"  (1.6 m), weight 253 lb 12.8 oz (115.1 kg), last menstrual period 01/22/2017, unknown if currently breastfeeding.  Physical Exam  Constitutional: She is oriented to person, place, and time. She appears well-developed and well-nourished. No distress.  HENT:  Head: Normocephalic.  Eyes: Pupils are equal, round, and reactive to light.  Cardiovascular: Normal rate.  Respiratory: Effort normal.  GI: Soft. She exhibits no distension. There is no  tenderness. There is no rebound and no guarding.  Musculoskeletal: Normal range of motion.  Neurological: She is alert and oriented to person, place, and time.  Skin: Skin is warm. She is not diaphoretic.  Psychiatric: Her behavior is normal.   Fetal Tracing: Baseline: 130 bpm Variability: Moderate  Accelerations: 15x15 Decelerations: None Toco: Occasional UI   MAU Course  Procedures  None  MDM  LR bolus X 1 Zantac IV Zofran IV Patient feeling better, tolerating PO fluids  Assessment and Plan   A:  1. Nausea and vomiting in pregnancy   2. [redacted] weeks gestation of pregnancy   3. Gastroesophageal reflux disease, esophagitis presence not specified     P:  Discharge home in stable condition Rx: Zofran, Zantac Return to MAU if symptoms worsen Small, frequent meals Follow up with OB in [place].   Venia Carbon I, NP 08/29/2017 6:05 PM

## 2017-08-29 NOTE — Discharge Instructions (Signed)
Morning Sickness Morning sickness is when you feel sick to your stomach (nauseous) during pregnancy. This nauseous feeling may or may not come with vomiting. It often occurs in the morning but can be a problem any time of day. Morning sickness is most common during the first trimester, but it may continue throughout pregnancy. While morning sickness is unpleasant, it is usually harmless unless you develop severe and continual vomiting (hyperemesis gravidarum). This condition requires more intense treatment. What are the causes? The cause of morning sickness is not completely known but seems to be related to normal hormonal changes that occur in pregnancy. What increases the risk? You are at greater risk if you:  Experienced nausea or vomiting before your pregnancy.  Had morning sickness during a previous pregnancy.  Are pregnant with more than one baby, such as twins.  How is this treated? Do not use any medicines (prescription, over-the-counter, or herbal) for morning sickness without first talking to your health care provider. Your health care provider may prescribe or recommend:  Vitamin B6 supplements.  Anti-nausea medicines.  The herbal medicine ginger.  Follow these instructions at home:  Only take over-the-counter or prescription medicines as directed by your health care provider.  Taking multivitamins before getting pregnant can prevent or decrease the severity of morning sickness in most women.  Eat a piece of dry toast or unsalted crackers before getting out of bed in the morning.  Eat five or six small meals a day.  Eat dry and bland foods (rice, baked potato). Foods high in carbohydrates are often helpful.  Do not drink liquids with your meals. Drink liquids between meals.  Avoid greasy, fatty, and spicy foods.  Get someone to cook for you if the smell of any food causes nausea and vomiting.  If you feel nauseous after taking prenatal vitamins, take the vitamins at  night or with a snack.  Snack on protein foods (nuts, yogurt, cheese) between meals if you are hungry.  Eat unsweetened gelatins for desserts.  Wearing an acupressure wristband (worn for sea sickness) may be helpful.  Acupuncture may be helpful.  Do not smoke.  Get a humidifier to keep the air in your house free of odors.  Get plenty of fresh air. Contact a health care provider if:  Your home remedies are not working, and you need medicine.  You feel dizzy or lightheaded.  You are losing weight. Get help right away if:  You have persistent and uncontrolled nausea and vomiting.  You pass out (faint). This information is not intended to replace advice given to you by your health care provider. Make sure you discuss any questions you have with your health care provider. Document Released: 04/26/2006 Document Revised: 08/11/2015 Document Reviewed: 08/20/2012 Elsevier Interactive Patient Education  2017 Elsevier Inc.   Nausea and Vomiting, Adult Feeling sick to your stomach (nausea) means that your stomach is upset or you feel like you have to throw up (vomit). Feeling more and more sick to your stomach can lead to throwing up. Throwing up happens when food and liquid from your stomach are thrown up and out the mouth. Throwing up can make you feel weak and cause you to get dehydrated. Dehydration can make you tired and thirsty, make you have a dry mouth, and make it so you pee (urinate) less often. Older adults and people with other diseases or a weak defense system (immune system) are at higher risk for dehydration. If you feel sick to your stomach or if  you throw up, it is important to follow instructions from your doctor about how to take care of yourself. Follow these instructions at home: Eating and drinking Follow these instructions as told by your doctor:  Take an oral rehydration solution (ORS). This is a drink that is sold at pharmacies and stores.  Drink clear fluids in  small amounts as you are able, such as: ? Water. ? Ice chips. ? Diluted fruit juice. ? Low-calorie sports drinks.  Eat bland, easy-to-digest foods in small amounts as you are able, such as: ? Bananas. ? Applesauce. ? Rice. ? Low-fat (lean) meats. ? Toast. ? Crackers.  Avoid fluids that have a lot of sugar or caffeine in them.  Avoid alcohol.  Avoid spicy or fatty foods.  General instructions  Drink enough fluid to keep your pee (urine) clear or pale yellow.  Wash your hands often. If you cannot use soap and water, use hand sanitizer.  Make sure that all people in your home wash their hands well and often.  Take over-the-counter and prescription medicines only as told by your doctor.  Rest at home while you get better.  Watch your condition for any changes.  Breathe slowly and deeply when you feel sick to your stomach.  Keep all follow-up visits as told by your doctor. This is important. Contact a doctor if:  You have a fever.  You cannot keep fluids down.  Your symptoms get worse.  You have new symptoms.  You feel sick to your stomach for more than two days.  You feel light-headed or dizzy.  You have a headache.  You have muscle cramps. Get help right away if:  You have pain in your chest, neck, arm, or jaw.  You feel very weak or you pass out (faint).  You throw up again and again.  You see blood in your throw-up.  Your throw-up looks like black coffee grounds.  You have bloody or black poop (stools) or poop that look like tar.  You have a very bad headache, a stiff neck, or both.  You have a rash.  You have very bad pain, cramping, or bloating in your belly (abdomen).  You have trouble breathing.  You are breathing very quickly.  Your heart is beating very quickly.  Your skin feels cold and clammy.  You feel confused.  You have pain when you pee.  You have signs of dehydration, such as: ? Dark pee, hardly any pee, or no  pee. ? Cracked lips. ? Dry mouth. ? Sunken eyes. ? Sleepiness. ? Weakness. These symptoms may be an emergency. Do not wait to see if the symptoms will go away. Get medical help right away. Call your local emergency services (911 in the [place] Do not drive yourself to the hospital. This information is not intended to replace advice given to you by your health care provider. Make sure you discuss any questions you have with your health care provider. Document Released: 08/22/2007 Document Revised: 09/23/2015 Document Reviewed: 11/09/2014 Elsevier Interactive Patient Education  2018 [employer].

## 2017-09-17 ENCOUNTER — Encounter (HOSPITAL_COMMUNITY): Payer: Self-pay

## 2017-09-17 ENCOUNTER — Inpatient Hospital Stay (HOSPITAL_COMMUNITY)
Admission: AD | Admit: 2017-09-17 | Discharge: 2017-09-18 | Disposition: A | Discharge status: Home / Self Care | Payer: Medicaid Other | Source: Ambulatory Visit | Attending: Family Medicine | Admitting: Family Medicine

## 2017-09-17 DIAGNOSIS — Z885 Allergy status to narcotic agent status: Secondary | ICD-10-CM | POA: Diagnosis not present

## 2017-09-17 DIAGNOSIS — Z87891 Personal history of nicotine dependence: Secondary | ICD-10-CM | POA: Diagnosis not present

## 2017-09-17 DIAGNOSIS — Z3A35 35 weeks gestation of pregnancy: Secondary | ICD-10-CM

## 2017-09-17 DIAGNOSIS — R112 Nausea with vomiting, unspecified: Secondary | ICD-10-CM

## 2017-09-17 DIAGNOSIS — R109 Unspecified abdominal pain: Secondary | ICD-10-CM | POA: Diagnosis not present

## 2017-09-17 DIAGNOSIS — O368130 Decreased fetal movements, third trimester, not applicable or unspecified: Secondary | ICD-10-CM | POA: Insufficient documentation

## 2017-09-17 DIAGNOSIS — O0933 Supervision of pregnancy with insufficient antenatal care, third trimester: Secondary | ICD-10-CM | POA: Diagnosis not present

## 2017-09-17 DIAGNOSIS — Z79899 Other long term (current) drug therapy: Secondary | ICD-10-CM | POA: Diagnosis not present

## 2017-09-17 DIAGNOSIS — O212 Late vomiting of pregnancy: Secondary | ICD-10-CM | POA: Insufficient documentation

## 2017-09-17 DIAGNOSIS — O26893 Other specified pregnancy related conditions, third trimester: Secondary | ICD-10-CM | POA: Insufficient documentation

## 2017-09-17 LAB — URINALYSIS, ROUTINE W REFLEX MICROSCOPIC
Bilirubin Urine: NEGATIVE
Glucose, UA: 50 mg/dL — AB
HGB URINE DIPSTICK: NEGATIVE
Ketones, ur: 20 mg/dL — AB
Nitrite: NEGATIVE
Protein, ur: NEGATIVE mg/dL
Specific Gravity, Urine: 1.013 (ref 1.005–1.030)
pH: 5 (ref 5.0–8.0)

## 2017-09-17 MED ORDER — ONDANSETRON 4 MG PO TBDP
4.0000 mg | ORAL_TABLET | Freq: Once | ORAL | Status: AC
  Administered 2017-09-17: 4 mg via ORAL
  Filled 2017-09-17: qty 1

## 2017-09-17 NOTE — MAU Provider Note (Addendum)
Chief Complaint:  Decreased Fetal Movement and Abdominal Pain   First Provider Initiated Contact with Patient 09/17/17 2056     HPI: Martina SinnerMyesha Boone is a 22 y.o. G2P0010 at 3235w3dwho presents to maternity admissions reporting decreased fetal movement for the past 2 days.  Denies leaking or bleeding.  States does not really feel bad, but is just worried about baby and wants an ultrasound.  Denies pain to me.  States has had nausea and vomiting whole pregnancy and has not been able to keep anything down for "a long time".      [place] in [place] in over a month.  States is here visiting her sister and may want to deliver here.  Has not tried to get prenatal care here.  Cannot really say why she does not want to go back for visits.  Does not give many details. Just says "I don't know" when asked what she is most worried about.  . She denies LOF, vaginal bleeding, vaginal itching/burning, urinary symptoms, h/a, dizziness, n/v, diarrhea, constipation or fever/chills.    Abdominal Pain  The onset quality is gradual. The problem occurs intermittently (denies pain to me, told RN she had pain). The problem has been resolved. The pain is located in the generalized abdominal region. The patient is experiencing no pain. Associated symptoms include nausea and vomiting. Pertinent negatives include no constipation, diarrhea, dysuria, fever, headaches or myalgias. Nothing aggravates the pain. The pain is relieved by nothing. She has tried nothing for the symptoms.  Emesis   This is a recurrent problem. The current episode started more than 1 month ago. The problem occurs intermittently. The problem has been waxing and waning. There has been no fever. Associated symptoms include abdominal pain. Pertinent negatives include no diarrhea, fever, headaches or myalgias. She has tried nothing (states ran out of Zofran) for the symptoms.   RN Note: States she has been feeling sore and overall achy recently.   Also has not felt baby move at all for the past 2 days.  No LOF/VB.  Feeling some abdominal cramping.     Past Medical History: Past Medical History:  Diagnosis Date  . Hypertension   . Pregnancy induced hypertension     Past obstetric history: OB History  Gravida Para Term Preterm AB Living  2       1 0  SAB TAB Ectopic Multiple Live Births  1            # Outcome Date GA Lbr Len/2nd Weight Sex Delivery Anes PTL Lv  2 Current           1 SAB 2017 [id number]      N FD    Past Surgical History: Past Surgical History:  Procedure Laterality Date  . NO PAST SURGERIES    . WISDOM TOOTH EXTRACTION      Family History: No family history on file.  Social History: Social History   Tobacco Use  . Smoking status: Former Smoker    Last attempt to quit: 01/18/2017    Years since quitting: 0.6  . Smokeless tobacco: Never Used  Substance Use Topics  . Alcohol use: No  . Drug use: No    Allergies:  Allergies  Allergen Reactions  . Pear Swelling    States her throat starts closing up when she eats one  . Percocet [Oxycodone-Acetaminophen]     itching    Meds:  Medications Prior to Admission  Medication Sig Dispense Refill  Last Dose  . ranitidine (ZANTAC) 150 MG tablet Take 1 tablet (150 mg total) by mouth 2 (two) times daily. 60 tablet 0 09/17/2017 at Unknown time  . ondansetron (ZOFRAN ODT) 8 MG disintegrating tablet Take 1 tablet (8 mg total) by mouth every 8 (eight) hours as needed for nausea or vomiting. 20 tablet 0     I have reviewed patient's Past Medical Hx, Surgical Hx, Family Hx, Social Hx, medications and allergies.   ROS:  Review of Systems  Constitutional: Negative for fever.  Gastrointestinal: Positive for abdominal pain, nausea and vomiting. Negative for constipation and diarrhea.  Genitourinary: Negative for dysuria.  Musculoskeletal: Negative for myalgias.  Neurological: Negative for headaches.   Other systems negative  Physical Exam   Patient Vitals  for the past 24 hrs:  BP Temp Temp src Pulse Resp SpO2 Height Weight  09/17/17 2039 125/73 98 F (36.7 C) Oral 93 19 100 % - -  09/17/17 2030 - - - - - - 5\' 3"  (1.6 m) 259 lb (117.5 kg)   Vitals:   09/17/17 2030 09/17/17 2039 09/18/17 0045  BP:  125/73 125/84  Pulse:  93 (!) 101  Resp:  19 19  Temp:  98 F (36.7 C)   TempSrc:  Oral   SpO2:  100%   Weight: 259 lb (117.5 kg)    Height: 5\' 3"  (1.6 m)      Constitutional: Well-developed, well-nourished female in no acute distress.  Cardiovascular: normal rate and rhythm Respiratory: normal effort, clear to auscultation bilaterally GI: Abd soft, non-tender, gravid appropriate for gestational age.   No rebound or guarding. MS: Extremities nontender, no edema, normal ROM Neurologic: Alert and oriented x 4.  GU: Neg CVAT.  PELVIC EXAM:     FHT:  Baseline 140 , moderate variability, accelerations present, no decelerations Contractions:  Irregular, infrequent   Labs: --/--/A POS (12/10 1647) Results for orders placed or performed during the hospital encounter of 09/17/17 (from the past 24 hour(s))  Urinalysis, Routine w reflex microscopic     Status: Abnormal   Collection Time: 09/17/17  8:46 PM  Result Value Ref Range   Color, Urine YELLOW YELLOW   APPearance HAZY (A) CLEAR   Specific Gravity, Urine 1.013 1.005 - 1.030   pH 5.0 5.0 - 8.0   Glucose, UA 50 (A) NEGATIVE mg/dL   Hgb urine dipstick NEGATIVE NEGATIVE   Bilirubin Urine NEGATIVE NEGATIVE   Ketones, ur 20 (A) NEGATIVE mg/dL   Protein, ur NEGATIVE NEGATIVE mg/dL   Nitrite NEGATIVE NEGATIVE   Leukocytes, UA MODERATE (A) NEGATIVE   RBC / HPF 0-5 0 - 5 RBC/hpf   WBC, UA 0-5 0 - 5 WBC/hpf   Bacteria, UA RARE (A) NONE SEEN   Squamous Epithelial / LPF 0-5 0 - 5    Imaging:  No results found.  MAU Course/MDM: I have ordered labs and reviewed results. Urine is dilute and does not reflect dehydration to the extent she reports (stated she could not keep down even water  for quite a few days) NST reviewed and is reactive. Category I  Treatments in MAU included EFM I did a brief bedside US to reassure patient.  Baby was moving. Normal appearance. Steady heart rate. Charlott Holler presentation   Assessment: Single intrauterine pregnancy at [redacted]w[redacted]d Limited prenatal care Nonspecific complaints Nausea and vomiting No evidence of dehydration, tolerated PO intake Decreased perception of fetal movement, reactive Nonstress test  Plan: Discharge home Labor precautions and fetal kick  counts Follow up in Office for prenatal visits and recheck of status Rx Promethazine until the pharmacy can refill Zofran Encouraged to call her doctor in AM to discuss prenatal care plan  Pt stable at time of discharge.  Wynelle Bourgeois CNM, MSN Certified Nurse-Midwife 09/17/2017 8:57 PM

## 2017-09-17 NOTE — MAU Note (Signed)
States she has been feeling sore and overall achy recently.  Also has not felt baby move at all for the past 2 days.  No LOF/VB.  Feeling some abdominal cramping.

## 2017-09-18 MED ORDER — PROMETHAZINE HCL 25 MG PO TABS: 25.0000 mg | ORAL_TABLET | Freq: Four times a day (QID) | ORAL | 2 refills | Status: DC | PRN

## 2017-09-18 NOTE — Discharge Instructions (Signed)
Fetal Movement Counts Patient Name: ________________________________________________ Patient Due Date: ____________________ What is a fetal movement count? A fetal movement count is the number of times that you feel your baby move during a certain amount of time. This may also be called a fetal kick count. A fetal movement count is recommended for every pregnant woman. You may be asked to start counting fetal movements as early as week 28 of your pregnancy. Pay attention to when your baby is most active. You may notice your baby's sleep and wake cycles. You may also notice things that make your baby move more. You should do a fetal movement count:  When your baby is normally most active.  At the same time each day.  A good time to count movements is while you are resting, after having something to eat and drink. How do I count fetal movements? 1. Find a quiet, comfortable area. Sit, or lie down on your side. 2. Write down the date, the start time and stop time, and the number of movements that you felt between those two times. Take this information with you to your health care visits. 3. For 2 hours, count kicks, flutters, swishes, rolls, and jabs. You should feel at least 10 movements during 2 hours. 4. You may stop counting after you have felt 10 movements. 5. If you do not feel 10 movements in 2 hours, have something to eat and drink. Then, keep resting and counting for 1 hour. If you feel at least 4 movements during that hour, you may stop counting. Contact a health care provider if:  You feel fewer than 4 movements in 2 hours.  Your baby is not moving like he or she usually does. Date: ____________ Start time: ____________ Stop time: ____________ Movements: ____________ Date: ____________ Start time: ____________ Stop time: ____________ Movements: ____________ Date: ____________ Start time: ____________ Stop time: ____________ Movements: ____________ Date: ____________ Start time:  ____________ Stop time: ____________ Movements: ____________ Date: ____________ Start time: ____________ Stop time: ____________ Movements: ____________ Date: ____________ Start time: ____________ Stop time: ____________ Movements: ____________ Date: ____________ Start time: ____________ Stop time: ____________ Movements: ____________ Date: ____________ Start time: ____________ Stop time: ____________ Movements: ____________ Date: ____________ Start time: ____________ Stop time: ____________ Movements: ____________ This information is not intended to replace advice given to you by your health care provider. Make sure you discuss any questions you have with your health care provider. Document Released: 04/04/2006 Document Revised: 11/02/2015 Document Reviewed: 04/14/2015 Elsevier Interactive Patient Education  2018 [employer].  Morning Sickness Morning sickness is when you feel sick to your stomach (nauseous) during pregnancy. This nauseous feeling may or may not come with vomiting. It often occurs in the morning but can be a problem any time of day. Morning sickness is most common during the first trimester, but it may continue throughout pregnancy. While morning sickness is unpleasant, it is usually harmless unless you develop severe and continual vomiting (hyperemesis gravidarum). This condition requires more intense treatment. What are the causes? The cause of morning sickness is not completely known but seems to be related to normal hormonal changes that occur in pregnancy. What increases the risk? You are at greater risk if you:  Experienced nausea or vomiting before your pregnancy.  Had morning sickness during a previous pregnancy.  Are pregnant with more than one baby, such as twins.  How is this treated? Do not use any medicines (prescription, over-the-counter, or herbal) for morning sickness without first talking to your health care provider.  Your health care provider may  prescribe or recommend:  Vitamin B6 supplements.  Anti-nausea medicines.  The herbal medicine ginger.  Follow these instructions at home:  Only take over-the-counter or prescription medicines as directed by your health care provider.  Taking multivitamins before getting pregnant can prevent or decrease the severity of morning sickness in most women.  Eat a piece of dry toast or unsalted crackers before getting out of bed in the morning.  Eat five or six small meals a day.  Eat dry and bland foods (rice, baked potato). Foods high in carbohydrates are often helpful.  Do not drink liquids with your meals. Drink liquids between meals.  Avoid greasy, fatty, and spicy foods.  Get someone to cook for you if the smell of any food causes nausea and vomiting.  If you feel nauseous after taking prenatal vitamins, take the vitamins at night or with a snack.  Snack on protein foods (nuts, yogurt, cheese) between meals if you are hungry.  Eat unsweetened gelatins for desserts.  Wearing an acupressure wristband (worn for sea sickness) may be helpful.  Acupuncture may be helpful.  Do not smoke.  Get a humidifier to keep the air in your house free of odors.  Get plenty of fresh air. Contact a health care provider if:  Your home remedies are not working, and you need medicine.  You feel dizzy or lightheaded.  You are losing weight. Get help right away if:  You have persistent and uncontrolled nausea and vomiting.  You pass out (faint). This information is not intended to replace advice given to you by your health care provider. Make sure you discuss any questions you have with your health care provider. Document Released: 04/26/2006 Document Revised: 08/11/2015 Document Reviewed: 08/20/2012 Elsevier Interactive Patient Education  2017 [employer]

## 2017-12-08 ENCOUNTER — Other Ambulatory Visit: Payer: Self-pay

## 2017-12-08 ENCOUNTER — Encounter (HOSPITAL_COMMUNITY): Payer: Self-pay | Admitting: *Deleted

## 2017-12-08 ENCOUNTER — Emergency Department (HOSPITAL_COMMUNITY)
Admission: EM | Admit: 2017-12-08 | Discharge: 2017-12-09 | Disposition: A | Discharge status: Home / Self Care | Payer: Medicaid Other | Attending: Emergency Medicine | Admitting: Emergency Medicine

## 2017-12-08 DIAGNOSIS — R05 Cough: Secondary | ICD-10-CM | POA: Diagnosis present

## 2017-12-08 DIAGNOSIS — Z5321 Procedure and treatment not carried out due to patient leaving prior to being seen by health care provider: Secondary | ICD-10-CM | POA: Diagnosis not present

## 2017-12-08 NOTE — ED Triage Notes (Signed)
The pt is c/o a cold and cough for 3-4 days several family members have the same  Unsure of temp  lmp  Now  Period [place]

## 2017-12-09 NOTE — ED Notes (Signed)
Called Patient to reassess vitals and had no answer.

## 2017-12-09 NOTE — ED Notes (Signed)
Patient stated that she did not want to wait any longer. Patient was encouraged to stay, however patient insisted on leaving.

## 2018-01-11 ENCOUNTER — Encounter (HOSPITAL_COMMUNITY): Payer: Self-pay

## 2019-04-17 ENCOUNTER — Encounter (HOSPITAL_COMMUNITY): Payer: Self-pay

## 2019-04-17 ENCOUNTER — Other Ambulatory Visit: Payer: Self-pay

## 2019-04-17 ENCOUNTER — Ambulatory Visit (HOSPITAL_COMMUNITY)
Admission: EM | Admit: 2019-04-17 | Discharge: 2019-04-17 | Disposition: A | Discharge status: Home / Self Care | Payer: Medicaid Other | Attending: Physician Assistant | Admitting: Physician Assistant

## 2019-04-17 DIAGNOSIS — R112 Nausea with vomiting, unspecified: Secondary | ICD-10-CM | POA: Diagnosis not present

## 2019-04-17 DIAGNOSIS — Z87891 Personal history of nicotine dependence: Secondary | ICD-10-CM | POA: Insufficient documentation

## 2019-04-17 DIAGNOSIS — Z79899 Other long term (current) drug therapy: Secondary | ICD-10-CM | POA: Insufficient documentation

## 2019-04-17 DIAGNOSIS — Z20822 Contact with and (suspected) exposure to covid-19: Secondary | ICD-10-CM | POA: Diagnosis not present

## 2019-04-17 DIAGNOSIS — Z7982 Long term (current) use of aspirin: Secondary | ICD-10-CM | POA: Diagnosis not present

## 2019-04-17 DIAGNOSIS — Z885 Allergy status to narcotic agent status: Secondary | ICD-10-CM | POA: Diagnosis not present

## 2019-04-17 DIAGNOSIS — L739 Follicular disorder, unspecified: Secondary | ICD-10-CM | POA: Insufficient documentation

## 2019-04-17 DIAGNOSIS — H8110 Benign paroxysmal vertigo, unspecified ear: Secondary | ICD-10-CM | POA: Insufficient documentation

## 2019-04-17 MED ORDER — ONDANSETRON 8 MG PO TBDP: 8.0000 mg | ORAL_TABLET | Freq: Three times a day (TID) | ORAL | 0 refills | Status: DC | PRN

## 2019-04-17 MED ORDER — CEPHALEXIN 500 MG PO CAPS: 500.0000 mg | ORAL_CAPSULE | Freq: Four times a day (QID) | ORAL | 0 refills | Status: DC

## 2019-04-17 MED ORDER — MECLIZINE HCL 50 MG PO TABS: 50.0000 mg | ORAL_TABLET | Freq: Three times a day (TID) | ORAL | 0 refills | Status: DC | PRN

## 2019-04-17 NOTE — ED Triage Notes (Signed)
Pt c/o abscess to central forehead/scalp line x 3-4 days; states clear drainage earlier today, then ceased. States has had nausea and vomiting x7 emesis when lying back.  Dizziness when leaning forward.  Also c/o scalp pain/tenderness to right occipital/parietal area.  Denies fever, chills.

## 2019-04-17 NOTE — Discharge Instructions (Addendum)
Remain in positions that do not cause dizziness or nausea  Drink plenty of water and consume small meals  Take the meclizine and zofran for nausea and vertigo, as needed every 8 hours  Take the keflex 4 times a day for 5 days.   If symptoms do not improve in 1-2 days please be reevaluated at urgent care of the emergency department.   If your Covid-19 test is positive, you will receive a phone call from [facility] regarding your results. Negative test results are not called. Both positive and negative results area always visible on MyChart. If you do not have a MyChart account, sign up instructions are in your discharge papers.   Persons who are directed to care for themselves at home may discontinue isolation under the following conditions:   At least 10 days have passed since symptom onset and  At least 24 hours have passed without running a fever (this means without the use of fever-reducing medications) and  Other symptoms have improved.  Persons infected with COVID-19 who never develop symptoms may discontinue isolation and other precautions 10 days after the date of their first positive COVID-19 test.

## 2019-04-17 NOTE — ED Provider Notes (Signed)
[facility]    CSN: [id number] Arrival date & time: 04/17/19  1710      History   Chief Complaint Chief Complaint  Patient presents with  . Abscess    HPI Jamie Boone is a 24 y.o. female.   Patient reports to urgent care today for concern of abscess on forehead and nausea and vomiting. Complaint on forehead has been present for 3-4 days. It has been painful to touch and at rest. There was clear drainage today but otherwise no other drainage. She reports no local hair loss, no itching. No fever and chills. She had her braids placed about 1 month ago and has not had issue previously.   She reports this morning lowering her head over the edge of her head and becoming instantly "dizzy and felt like id pass out" . She sat up and has since had nausea and vomiting when laying back in the supine position and continued dizziness if leaning forward. She has vomited about 7 times today. No blood in vomit. She is able to keep liquids down if sitting up right. Denies dizziness while sitting up right. No diarrhea or abdominal pain. She has never had an issue like this before. No hearing loss or tinnitus.     Past Medical History:  Diagnosis Date  . Hypertension   . Pregnancy induced hypertension     There are no problems to display for this patient.   Past Surgical History:  Procedure Laterality Date  . CESAREAN SECTION    . NO PAST SURGERIES    . WISDOM TOOTH EXTRACTION      OB History    Gravida  2   Para      Term      Preterm      AB  1   Living  0     SAB  1   TAB      Ectopic      Multiple      Live Births               Home Medications    Prior to Admission medications   Medication Sig Start Date End Date Taking? Authorizing Provider  amLODipine (NORVASC) 10 MG tablet Take 10 mg by mouth daily.   Yes [provider]  aspirin EC 81 MG tablet Take 81 mg by mouth daily.   Yes [provider]  cephALEXin (KEFLEX) 500 MG  capsule Take 1 capsule (500 mg total) by mouth 4 (four) times daily. 04/17/19   Naphtali Riede, Veryl Speak, PA-C  meclizine (ANTIVERT) 50 MG tablet Take 1 tablet (50 mg total) by mouth 3 (three) times daily as needed for dizziness or nausea. 04/17/19   Lakeena Downie, Veryl Speak, PA-C  ondansetron (ZOFRAN ODT) 8 MG disintegrating tablet Take 1 tablet (8 mg total) by mouth every 8 (eight) hours as needed for nausea or vomiting. 04/17/19   Siddharth Babington, Veryl Speak, PA-C  promethazine (PHENERGAN) 25 MG tablet Take 1 tablet (25 mg total) by mouth every 6 (six) hours as needed for nausea or vomiting. 09/18/17   Aviva Signs, CNM  ranitidine (ZANTAC) 150 MG tablet Take 1 tablet (150 mg total) by mouth 2 (two) times daily. 08/29/17   Rasch, Harolyn Rutherford, NP    Family History Family History  Problem Relation Age of Onset  . Healthy Mother     Social History Social History   Tobacco Use  . Smoking status: Former Smoker    Quit date: 01/18/2017  Years since quitting: 2.2  . Smokeless tobacco: Never Used  Substance Use Topics  . Alcohol use: No  . Drug use: No     Allergies   Pear and Percocet [oxycodone-acetaminophen]   Review of Systems Review of Systems  Constitutional: Negative for chills and fever.  HENT: Negative for ear pain, hearing loss, sore throat and tinnitus.   Eyes: Negative for pain and visual disturbance.  Respiratory: Negative for cough and shortness of breath.   Cardiovascular: Negative for chest pain and palpitations.  Gastrointestinal: Positive for nausea and vomiting. Negative for abdominal pain and diarrhea.  Genitourinary: Negative for dysuria and hematuria.  Musculoskeletal: Negative for arthralgias and back pain.  Skin: Positive for wound. Negative for color change and rash.  Neurological: Positive for dizziness. Negative for seizures, syncope and headaches.  All other systems reviewed and are negative.    Physical Exam Triage Vital Signs ED Triage Vitals [04/17/19 1733]  Enc Vitals Group      BP      Pulse      Resp      Temp      Temp src      SpO2      Weight      Height      Head Circumference      Peak Flow      Pain Score 8     Pain Loc      Pain Edu?      Excl. in [place]?    No data found.  Updated Vital Signs BP (!) 138/95 (BP Location: Left Wrist)   Pulse 100   Temp 99.4 F (37.4 C) (Oral)   Resp 20   LMP 12/28/2018 (Approximate)   SpO2 100%   Visual Acuity Right Eye Distance:   Left Eye Distance:   Bilateral Distance:    Right Eye Near:   Left Eye Near:    Bilateral Near:     Physical Exam Vitals and nursing note reviewed.  Constitutional:      General: She is not in acute distress.    Appearance: Normal appearance. She is well-developed. She is not ill-appearing.  HENT:     Head: Normocephalic and atraumatic.  Eyes:     General: No scleral icterus.    Extraocular Movements: Extraocular movements intact.     Conjunctiva/sclera: Conjunctivae normal.     Pupils: Pupils are equal, round, and reactive to light.  Cardiovascular:     Rate and Rhythm: Normal rate and regular rhythm.     Heart sounds: No murmur.  Pulmonary:     Effort: Pulmonary effort is normal. No respiratory distress.     Breath sounds: Normal breath sounds.  Abdominal:     Palpations: Abdomen is soft.     Tenderness: There is no abdominal tenderness.  Musculoskeletal:     Cervical back: Neck supple.     Right lower leg: No edema.     Left lower leg: No edema.  Skin:    General: Skin is warm and dry.     Capillary Refill: Capillary refill takes less than 2 seconds.     Coloration: Skin is not jaundiced.     Comments: Papule on Left forehead scalp line. TTP. No drainage or fluctuance  Neurological:     General: No focal deficit present.     Mental Status: She is alert and oriented to person, place, and time.  Psychiatric:        Mood and Affect: Mood normal.  Behavior: Behavior normal.        Thought Content: Thought content normal.        Judgment: Judgment  normal.      UC Treatments / Results  Labs (all labs ordered are listed, but only abnormal results are displayed) Labs Reviewed  NOVEL CORONAVIRUS, NAA (HOSP ORDER, SEND-OUT TO REF LAB; TAT 18-24 HRS)    EKG   Radiology No results found.  Procedures Procedures (including critical care time)  Medications Ordered in UC Medications - No data to display  Initial Impression / Assessment and Plan / UC Course  I have reviewed the triage vital signs and the nursing notes.  Pertinent labs & imaging results that were available during my care of the patient were reviewed by me and considered in my medical decision making (see chart for details).     #Folliculitis #BPPV - folliculitis present. No abscess. Believe there is an anxiety aspect to pain. Keflex sent. - Vertigo like symptoms. Zofran and meclizine sent. Discussed monitoring for a few days and if not improved, return to clinic - COVID pcr sent   Final Clinical Impressions(s) / UC Diagnoses   Final diagnoses:  Folliculitis  Non-intractable vomiting with nausea, unspecified vomiting type  Benign paroxysmal positional vertigo, unspecified laterality     Discharge Instructions     Remain in positions that do not cause dizziness or nausea  Drink plenty of water and consume small meals  Take the meclizine and zofran for nausea and vertigo, as needed every 8 hours  Take the keflex 4 times a day for 5 days.   If symptoms do not improve in 1-2 days please be reevaluated at urgent care of the emergency department.   If your Covid-19 test is positive, you will receive a phone call from [facility] regarding your results. Negative test results are not called. Both positive and negative results area always visible on MyChart. If you do not have a MyChart account, sign up instructions are in your discharge papers.   Persons who are directed to care for themselves at home may discontinue isolation under the following  conditions:  . At least 10 days have passed since symptom onset and . At least 24 hours have passed without running a fever (this means without the use of fever-reducing medications) and . Other symptoms have improved.  Persons infected with COVID-19 who never develop symptoms may discontinue isolation and other precautions 10 days after the date of their first positive COVID-19 test.     ED Prescriptions    Medication Sig Dispense Auth. Provider   ondansetron (ZOFRAN ODT) 8 MG disintegrating tablet Take 1 tablet (8 mg total) by mouth every 8 (eight) hours as needed for nausea or vomiting. 12 tablet Daaron Dimarco, Veryl Speak, PA-C   meclizine (ANTIVERT) 50 MG tablet Take 1 tablet (50 mg total) by mouth 3 (three) times daily as needed for dizziness or nausea. 30 tablet Deundre Thong, Veryl Speak, PA-C   cephALEXin (KEFLEX) 500 MG capsule Take 1 capsule (500 mg total) by mouth 4 (four) times daily. 20 capsule Tenecia Ignasiak, Veryl Speak, PA-C     PDMP not reviewed this encounter.   Hermelinda Medicus, PA-C 04/17/19 1923

## 2019-04-19 LAB — NOVEL CORONAVIRUS, NAA (HOSP ORDER, SEND-OUT TO REF LAB; TAT 18-24 HRS): SARS-CoV-2, NAA: NOT DETECTED

## 2019-04-20 ENCOUNTER — Encounter (HOSPITAL_COMMUNITY): Payer: Self-pay

## 2019-04-20 ENCOUNTER — Other Ambulatory Visit: Payer: Self-pay

## 2019-04-20 ENCOUNTER — Emergency Department (HOSPITAL_COMMUNITY)
Admission: EM | Admit: 2019-04-20 | Discharge: 2019-04-20 | Disposition: A | Discharge status: Home / Self Care | Payer: Medicaid Other | Attending: Emergency Medicine | Admitting: Emergency Medicine

## 2019-04-20 DIAGNOSIS — H8110 Benign paroxysmal vertigo, unspecified ear: Secondary | ICD-10-CM

## 2019-04-20 DIAGNOSIS — I10 Essential (primary) hypertension: Secondary | ICD-10-CM | POA: Insufficient documentation

## 2019-04-20 DIAGNOSIS — R42 Dizziness and giddiness: Secondary | ICD-10-CM | POA: Diagnosis present

## 2019-04-20 DIAGNOSIS — Z87891 Personal history of nicotine dependence: Secondary | ICD-10-CM | POA: Diagnosis not present

## 2019-04-20 DIAGNOSIS — L02811 Cutaneous abscess of head [any part, except face]: Secondary | ICD-10-CM

## 2019-04-20 LAB — I-STAT BETA HCG BLOOD, ED (MC, WL, AP ONLY): I-stat hCG, quantitative: <5 m[IU]/mL (ref <5)

## 2019-04-20 LAB — COMPREHENSIVE METABOLIC PANEL WITH GFR
ALT: 26 U/L (ref 0–44)
AST: 22 U/L (ref 15–41)
Albumin: 3.5 g/dL (ref 3.5–5.0)
Alkaline Phosphatase: 62 U/L (ref 38–126)
Anion gap: 12 (ref 5–15)
BUN: 14 mg/dL (ref 6–20)
CO2: 21 mmol/L — ABNORMAL LOW (ref 22–32)
Calcium: 9.4 mg/dL (ref 8.9–10.3)
Chloride: 108 mmol/L (ref 98–111)
Creatinine, Ser: 1.1 mg/dL — ABNORMAL HIGH (ref 0.44–1.00)
GFR calc Af Amer: >60 mL/min
GFR calc non Af Amer: >60 mL/min
Glucose, Bld: 109 mg/dL — ABNORMAL HIGH (ref 70–99)
Potassium: 4.4 mmol/L (ref 3.5–5.1)
Sodium: 141 mmol/L (ref 135–145)
Total Bilirubin: 0.6 mg/dL (ref 0.3–1.2)
Total Protein: 7.5 g/dL (ref 6.5–8.1)

## 2019-04-20 LAB — URINALYSIS, ROUTINE W REFLEX MICROSCOPIC
Bilirubin Urine: NEGATIVE
Glucose, UA: NEGATIVE mg/dL
Ketones, ur: NEGATIVE mg/dL
Nitrite: NEGATIVE
Protein, ur: NEGATIVE mg/dL
Specific Gravity, Urine: 1.016 (ref 1.005–1.030)
pH: 5 (ref 5.0–8.0)

## 2019-04-20 LAB — CBC
HCT: 42.7 % (ref 36.0–46.0)
Hemoglobin: 14.1 g/dL (ref 12.0–15.0)
MCH: 25.9 pg — ABNORMAL LOW (ref 26.0–34.0)
MCHC: 33 g/dL (ref 30.0–36.0)
MCV: 78.5 fL — ABNORMAL LOW (ref 80.0–100.0)
Platelets: 268 10*3/uL (ref 150–400)
RBC: 5.44 MIL/uL — ABNORMAL HIGH (ref 3.87–5.11)
RDW: 14.1 % (ref 11.5–15.5)
WBC: 12.1 10*3/uL — ABNORMAL HIGH (ref 4.0–10.5)
nRBC: 0 % (ref 0.0–0.2)

## 2019-04-20 LAB — LIPASE, BLOOD: Lipase: 65 U/L — ABNORMAL HIGH (ref 11–51)

## 2019-04-20 MED ORDER — DOXYCYCLINE HYCLATE 100 MG PO CAPS: 100.0000 mg | ORAL_CAPSULE | Freq: Two times a day (BID) | ORAL | 0 refills | Status: AC

## 2019-04-20 MED ORDER — DOXYCYCLINE HYCLATE 100 MG PO TABS
100.0000 mg | ORAL_TABLET | Freq: Once | ORAL | Status: AC
  Administered 2019-04-20: 11:00:00 100 mg via ORAL
  Filled 2019-04-20: qty 1

## 2019-04-20 MED ORDER — MECLIZINE HCL 25 MG PO TABS: 25.0000 mg | ORAL_TABLET | Freq: Three times a day (TID) | ORAL | 0 refills | Status: DC | PRN

## 2019-04-20 MED ORDER — MECLIZINE HCL 25 MG PO TABS
25.0000 mg | ORAL_TABLET | Freq: Once | ORAL | Status: AC
  Administered 2019-04-20: 25 mg via ORAL
  Filled 2019-04-20: qty 1

## 2019-04-20 MED ORDER — LIDOCAINE HCL (PF) 1 % IJ SOLN
5.0000 mL | Freq: Once | INTRAMUSCULAR | Status: AC
  Administered 2019-04-20: 10:00:00 5 mL
  Filled 2019-04-20: qty 5

## 2019-04-20 NOTE — ED Notes (Signed)
Discussed with the patient and all questioned fully answered. Discharged

## 2019-04-20 NOTE — ED Notes (Signed)
ED Provider at bedside. 

## 2019-04-20 NOTE — ED Triage Notes (Signed)
Pt reports worsening dizziness, nausea and draining from her forehead abscess. Pt seen at [facility] on 1/29 and given abx, zofran and meclizine, pt took 1 of each medication but states it is not helping. Pt diaphoretic in triage, hypertensive, pt a.o

## 2019-04-20 NOTE — Discharge Instructions (Signed)
You were seen today with a skin infection in your scalp.  I am changing your antibiotics to doxycycline.  You can stop taking the Keflex.  Take the meclizine as needed for dizziness and drink plenty of fluids.  Your abscess was drained here.  We leave this open so you may experience some light bleeding throughout the day.  Keep the dressings clean and dry.  If you develop changes in your vision, severe headaches, fever, neck discomfort, or confusion you need to return to the emergency department immediately.  Take your antibiotics for the entire course and establish care with the primary care doctor for further follow-up.

## 2019-04-20 NOTE — ED Provider Notes (Signed)
Emergency Department Provider Note   I have reviewed the triage vital signs and the nursing notes.   HISTORY  Chief Complaint Dizziness, Abscess, and Nausea   HPI Jamie Boone is a 24 y.o. female with PMH of HTN and elevated BMI presents to the emergency department for evaluation of continued painful, draining area in the scalp starting last week with associated dizzy feeling.  Patient has had  approximately 5 days of symptoms.  She was seen at urgent care on 29 January.  She tells me she is was prescribed Zofran along with Keflex and has been taking this at home since yesterday.  She has had improved nausea but continues to have positional dizziness and pain with drainage from an area on her frontal scalp.  She denies fevers.  She has had improved nausea and no vomiting.  No diarrhea.  Denies double or blurry vision.  She tells me that her dizziness feels like moving/shifting and is worse if she lies flat or leans forward.  When she is still she has minimal symptoms.  She is not experiencing heart palpitations, shortness of breath, chest pain.   Past Medical History:  Diagnosis Date  . Hypertension   . Pregnancy induced hypertension     There are no problems to display for this patient.   Past Surgical History:  Procedure Laterality Date  . CESAREAN SECTION    . NO PAST SURGERIES    . WISDOM TOOTH EXTRACTION      Allergies Pear and Percocet [oxycodone-acetaminophen]  Family History  Problem Relation Age of Onset  . Healthy Mother     Social History Social History   Tobacco Use  . Smoking status: Former Smoker    Quit date: 01/18/2017    Years since quitting: 2.2  . Smokeless tobacco: Never Used  Substance Use Topics  . Alcohol use: No  . Drug use: No    Review of Systems  Constitutional: No fever/chills Eyes: No visual changes. ENT: No sore throat. Positive positional vertigo.  Cardiovascular: Denies chest pain. Respiratory: Denies shortness of  breath. Gastrointestinal: No abdominal pain. Positive nausea, no vomiting.  No diarrhea.  No constipation. Genitourinary: Negative for dysuria. Musculoskeletal: Negative for back pain. Skin: Draining area to the frontal scalp.  Neurological: Negative for focal weakness or numbness. Positive intermittent HA.   10-point ROS otherwise negative.  ____________________________________________   PHYSICAL EXAM:  VITAL SIGNS: ED Triage Vitals  Enc Vitals Group     BP 04/20/19 0900 (!) 139/100     Pulse Rate 04/20/19 0900 98     Resp 04/20/19 0855 16     Temp --      Temp Source 04/20/19 0855 Oral     SpO2 04/20/19 0900 97 %   Constitutional: Alert and oriented. Well appearing and in no acute distress. Eyes: Conjunctivae are normal. PERRL.  Head: Atraumatic. Nose: No congestion/rhinnorhea. Mouth/Throat: Mucous membranes are moist.  Oropharynx non-erythematous. Neck: No stridor.  No meningeal signs.   Cardiovascular: Normal rate, regular rhythm. Good peripheral circulation. Grossly normal heart sounds.   Respiratory: Normal respiratory effort.  No retractions. Lungs CTAB. Gastrointestinal: Soft and nontender. No distention.  Musculoskeletal: No lower extremity tenderness nor edema. No gross deformities of extremities. Neurologic:  Normal speech and language. No gross focal neurologic deficits are appreciated. No CN deficits 2-12. 5/5 strength in the upper and lower extremities.  Skin:  Skin is warm and dry. 1 cm area of fluctuance with minimal erythema in the frontal scalp just  inside the hairline.  There is no appreciable surrounding cellulitis or facial induration.  I am able to express purulence from the area.   ____________________________________________   LABS (all labs ordered are listed, but only abnormal results are displayed)  Labs Reviewed  CBC - Abnormal; Notable for the following components:      Result Value   WBC 12.1 (*)    RBC 5.44 (*)    MCV 78.5 (*)    MCH 25.9  (*)    All other components within normal limits  URINALYSIS, ROUTINE W REFLEX MICROSCOPIC - Abnormal; Notable for the following components:   APPearance HAZY (*)    Hgb urine dipstick MODERATE (*)    Leukocytes,Ua TRACE (*)    Bacteria, UA RARE (*)    All other components within normal limits  COMPREHENSIVE METABOLIC PANEL - Abnormal; Notable for the following components:   CO2 21 (*)    Glucose, Bld 109 (*)    Creatinine, Ser 1.10 (*)    All other components within normal limits  LIPASE, BLOOD - Abnormal; Notable for the following components:   Lipase 65 (*)    All other components within normal limits  I-STAT BETA HCG BLOOD, ED ([facility], [facility], [facility] ONLY)   ____________________________________________  EKG   EKG Interpretation  Date/Time:  Monday April 20 2019 09:02:53 EST Ventricular Rate:  90 PR Interval:  138 QRS Duration: 74 QT Interval:  340 QTC Calculation: 415 R Axis:   79 Text Interpretation: Normal sinus rhythm Nonspecific T wave abnormality Abnormal ECG No STEMI Confirmed by Alona Bene [phone]) on 04/20/2019 9:23:09 AM       ____________________________________________  RADIOLOGY  No results found.  ____________________________________________   PROCEDURES  Procedure(s) performed:   Marland KitchenMarland KitchenIncision and Drainage  Date/Time: 04/20/2019 10:42 AM Performed by: Maia Plan, MD Authorized by: Maia Plan, MD   Consent:    Consent obtained:  Verbal   Consent given by:  Patient   Risks discussed:  Damage to other organs, bleeding, incomplete drainage, infection and pain Location:    Type:  Abscess   Size:  2   Location:  Head   Head location:  Scalp Pre-procedure details:    Skin preparation:  Betadine Anesthesia (see MAR for exact dosages):    Anesthesia method:  Local infiltration   Local anesthetic:  Lidocaine 1% w/o epi Procedure type:    Complexity:  Simple Procedure details:    Needle aspiration: no     Incision types:  Single straight    Incision depth:  Dermal   Scalpel blade:  11   Wound management:  Probed and deloculated   Drainage:  Purulent and bloody   Drainage amount:  Moderate   Wound treatment:  Wound left open   Packing materials:  None Post-procedure details:    Patient tolerance of procedure:  Tolerated well, no immediate complications     ____________________________________________   INITIAL IMPRESSION / ASSESSMENT AND PLAN / ED COURSE  Pertinent labs & imaging results that were available during my care of the patient were reviewed by me and considered in my medical decision making (see chart for details).   Patient presents to the emergency department for evaluation of face/scalp abscess with continued dizziness.  Afebrile here with no subjective fevers at home.  There is no significant cellulitis type component to this presentation or face cellulitis.  My suspicion for deeper space infection in the face or CNS infection is exceedingly low with normal exam. Vertigo is  positional. No encephalopathy. Labs with leukocytosis. Doubt sepsis. Plan for meclizine, I&D of scalp abscess and will likely broaden abx coverage to include MRSA.   10:40 AM  Lab work reviewed.  No evidence of urine infection.  Patient does have a mild leukocytosis.  No other significant abnormalities on exam.  The abscess was incised and drained at the bedside after obtaining verbal consent from the patient.  I expressed a moderate amount of purulence.  The abscess after drainage is not large enough to require packing.  I am changing antibiotics from Keflex to doxycycline.  I have discussed return precautions to the emergency department in detail with the patient and provided these in writing.  She will try meclizine at home for her vertigo.  This is very positional and doubt a central process or deeper space infection to require imaging at this time. Discussed wound care at home and PCP information provided at discharge to call for appointment.   ____________________________________________  FINAL CLINICAL IMPRESSION(S) / ED DIAGNOSES  Final diagnoses:  Scalp abscess  Benign paroxysmal positional vertigo, unspecified laterality     MEDICATIONS GIVEN DURING THIS VISIT:  Medications  lidocaine (PF) (XYLOCAINE) 1 % injection 5 mL (has no administration in time range)  doxycycline (VIBRA-TABS) tablet 100 mg (has no administration in time range)  meclizine (ANTIVERT) tablet 25 mg (25 mg Oral Given 04/20/19 1023)     NEW OUTPATIENT MEDICATIONS STARTED DURING THIS VISIT:  New Prescriptions   DOXYCYCLINE (VIBRAMYCIN) 100 MG CAPSULE    Take 1 capsule (100 mg total) by mouth 2 (two) times daily for 7 days.   MECLIZINE (ANTIVERT) 25 MG TABLET    Take 1 tablet (25 mg total) by mouth 3 (three) times daily as needed for dizziness.    Note:  This document was prepared using Dragon voice recognition software and may include unintentional dictation errors.  Alona Bene, MD, [facility] Emergency Medicine    Misael Mcgaha, Arlyss Repress, MD 04/20/19 1045

## 2019-09-10 IMAGING — US US OB TRANSVAGINAL
2 series · 15 of 28 positions shown · non-contrast
Comparison: CT abdomen pelvis 02/19/2016

CLINICAL DATA: Pregnant patient with lower abdominal pain.

EXAM:
OBSTETRIC <14 WK US AND TRANSVAGINAL OB US
TECHNIQUE: Both transabdominal and transvaginal ultrasound examinations were
performed for complete evaluation of the gestation as well as the
maternal uterus, adnexal regions, and pelvic cul-de-sac.
Transvaginal technique was performed to assess early pregnancy.

[Series 1: us ob transvaginal · 14 of 69 slices shown (1 of 2)]
[im 1/69]
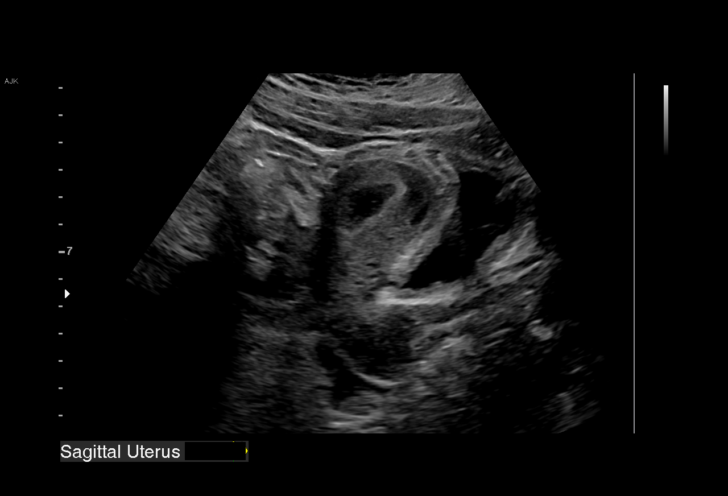
[im 6/69]
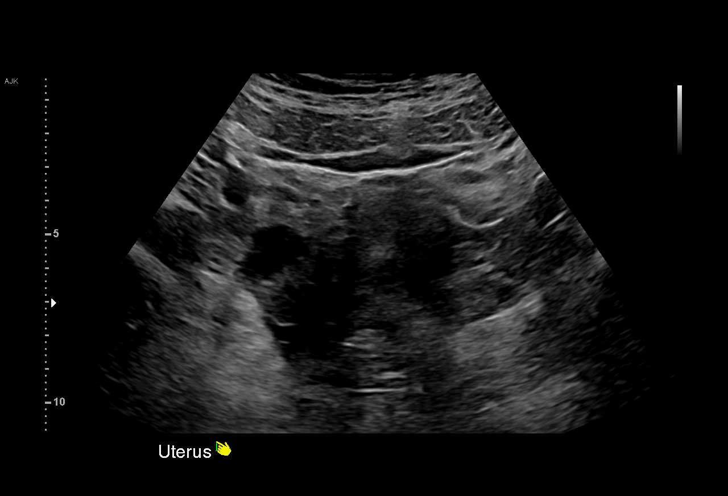
[im 11/69]
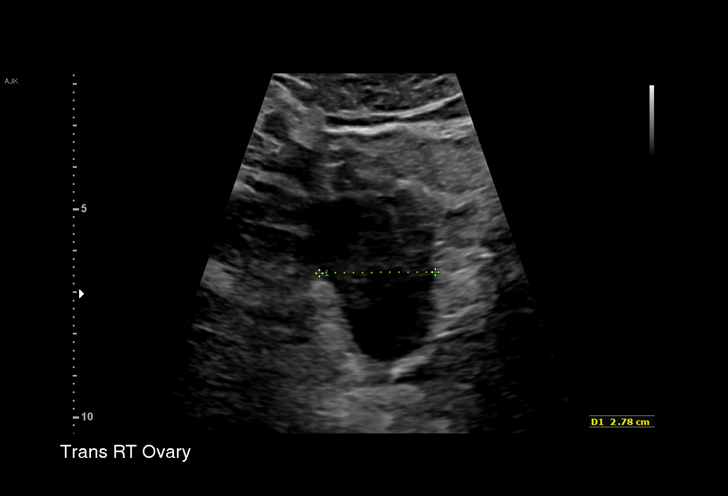
[im 16/69]
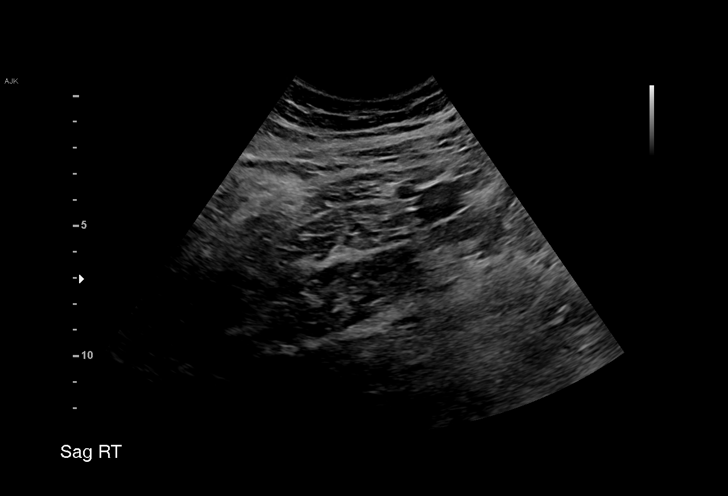
[im 21/69]
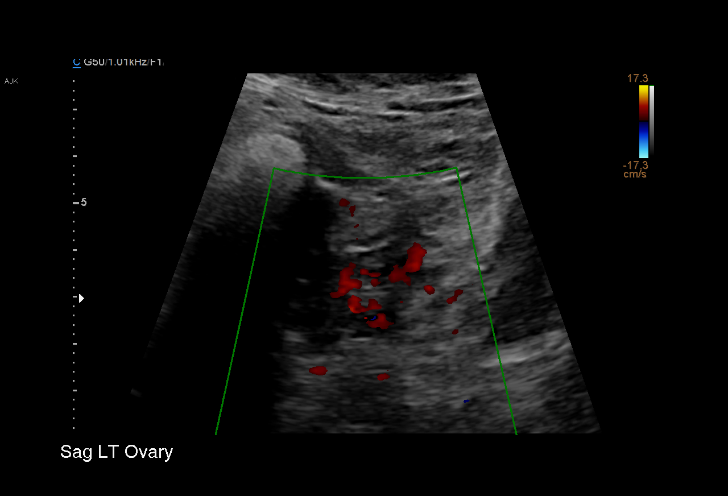
[im 27/69]
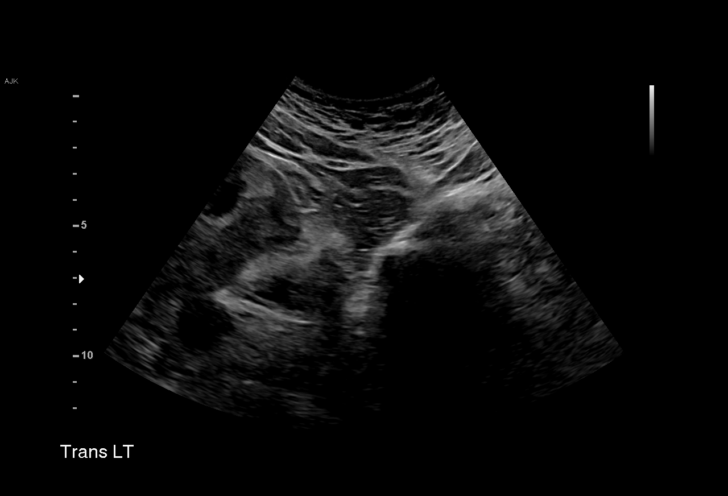
[im 32/69]
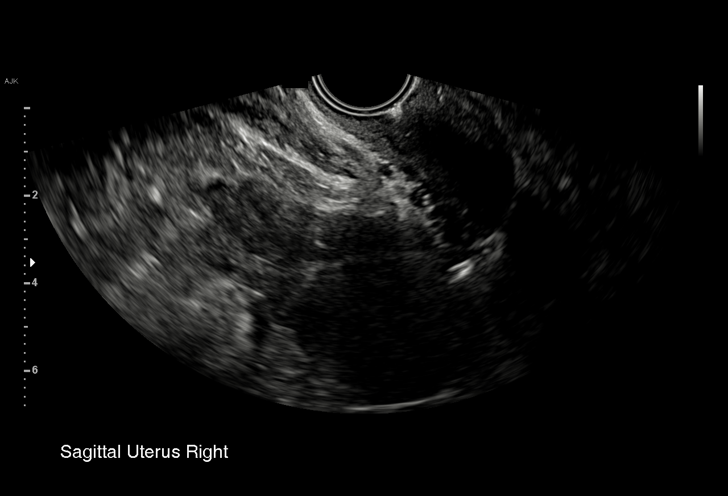
[im 37/69]
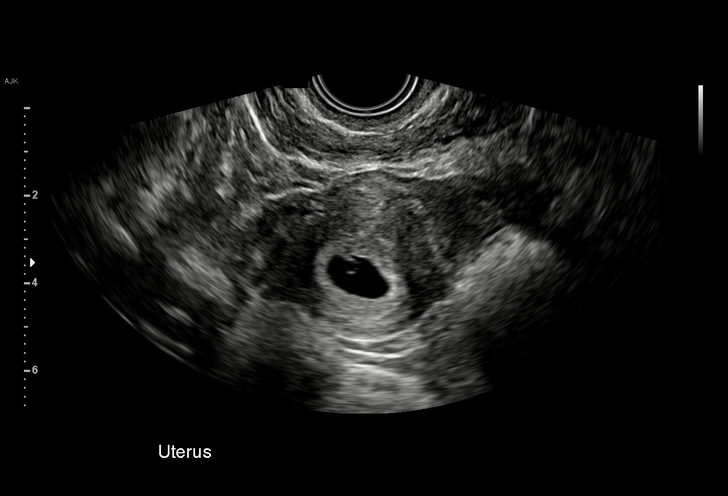
[im 40/69]
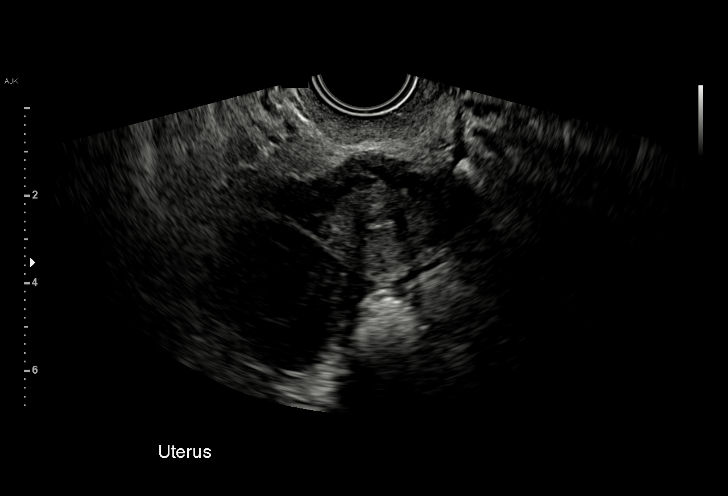
[im 45/69]
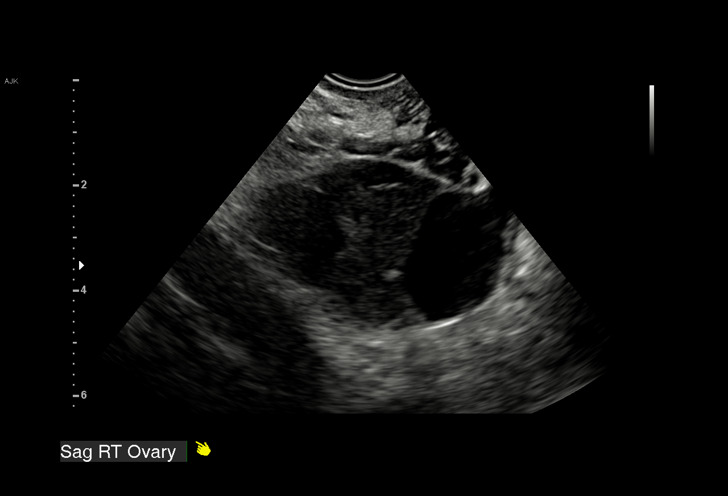
[im 50/69]
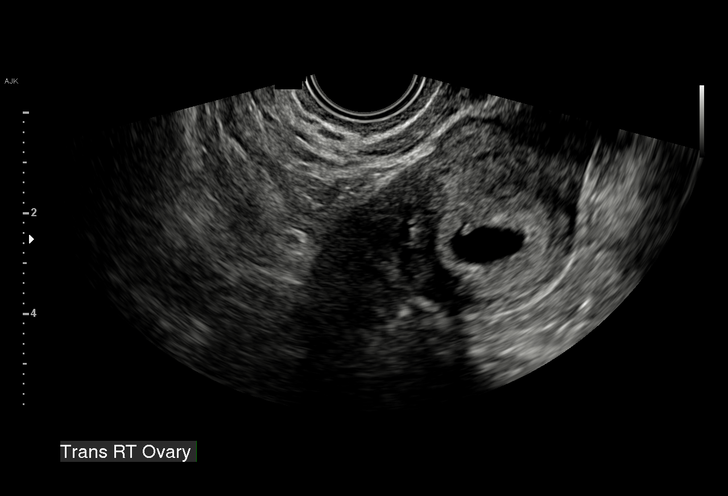
[im 55/69]
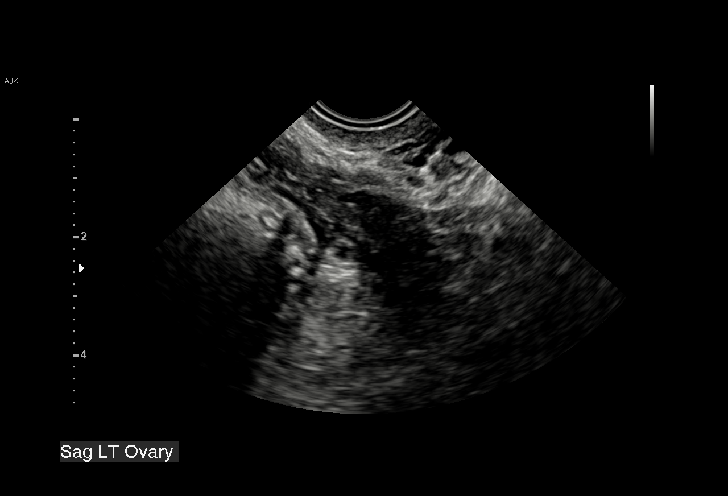
[im 61/69]
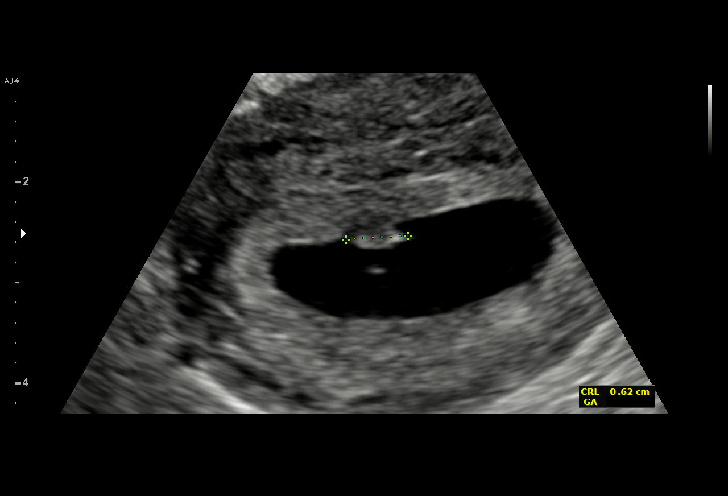
[im 66/69]
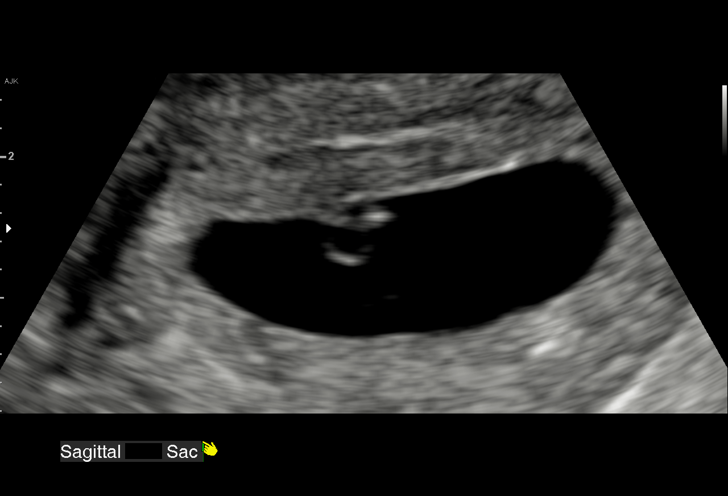

[Series 2: us ob transvaginal · 1 of 1 slices shown (2 of 2)]
[im 1/1]
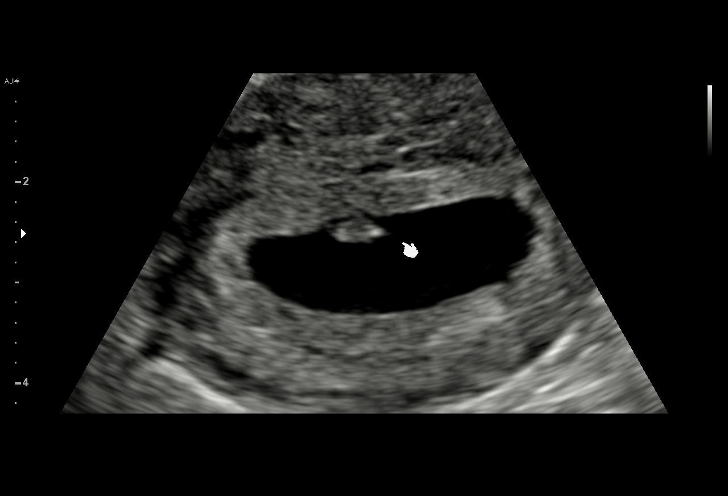

[15 of 28 positions shown; findings below may reference images not displayed]

FINDINGS: Intrauterine gestational sac: Single

Yolk sac:  Visualized.

Embryo:  Visualized.

Cardiac Activity: Visualized.

Heart Rate: 116  bpm

CRL:  6  mm   6 w   2 d                  US EDC: 10/19/2017

Subchorionic hemorrhage:  None visualized.

Maternal uterus/adnexae: Corpus luteum within the right ovary.
Normal left ovary. Trace free fluid in the pelvis.
IMPRESSION: Single live intrauterine gestation.  No subchorionic hemorrhage.

## 2019-10-28 ENCOUNTER — Encounter (HOSPITAL_COMMUNITY): Payer: Self-pay

## 2019-10-28 ENCOUNTER — Encounter (INDEPENDENT_AMBULATORY_CARE_PROVIDER_SITE_OTHER): Payer: Self-pay

## 2019-10-28 ENCOUNTER — Other Ambulatory Visit: Payer: Self-pay

## 2019-10-28 ENCOUNTER — Ambulatory Visit (HOSPITAL_COMMUNITY)
Admission: EM | Admit: 2019-10-28 | Discharge: 2019-10-28 | Disposition: A | Discharge status: Home / Self Care | Payer: Medicaid Other | Attending: Physician Assistant | Admitting: Physician Assistant

## 2019-10-28 DIAGNOSIS — Z885 Allergy status to narcotic agent status: Secondary | ICD-10-CM | POA: Insufficient documentation

## 2019-10-28 DIAGNOSIS — Z79899 Other long term (current) drug therapy: Secondary | ICD-10-CM | POA: Diagnosis not present

## 2019-10-28 DIAGNOSIS — R05 Cough: Secondary | ICD-10-CM | POA: Diagnosis present

## 2019-10-28 DIAGNOSIS — U071 COVID-19: Secondary | ICD-10-CM | POA: Diagnosis not present

## 2019-10-28 DIAGNOSIS — I10 Essential (primary) hypertension: Secondary | ICD-10-CM | POA: Diagnosis not present

## 2019-10-28 DIAGNOSIS — J069 Acute upper respiratory infection, unspecified: Secondary | ICD-10-CM | POA: Diagnosis not present

## 2019-10-28 DIAGNOSIS — Z87891 Personal history of nicotine dependence: Secondary | ICD-10-CM | POA: Diagnosis not present

## 2019-10-28 DIAGNOSIS — Z7982 Long term (current) use of aspirin: Secondary | ICD-10-CM | POA: Insufficient documentation

## 2019-10-28 NOTE — Discharge Instructions (Addendum)
Your covid test is pending 

## 2019-10-28 NOTE — ED Provider Notes (Signed)
[facility]    CSN: [id number] Arrival date & time: 10/28/19  1353      History   Chief Complaint Chief Complaint  Patient presents with  . Covid Testing    HPI Jamie Boone is a 24 y.o. female.   Pt complains of a cough and congestion.  Pt is here with a child that has a fever and congestion.  Pt request a covid test   The history is provided by the patient. No language interpreter was used.    Past Medical History:  Diagnosis Date  . Hypertension   . Pregnancy induced hypertension     There are no problems to display for this patient.   Past Surgical History:  Procedure Laterality Date  . CESAREAN SECTION    . NO PAST SURGERIES    . WISDOM TOOTH EXTRACTION      OB History    Gravida  2   Para      Term      Preterm      AB  1   Living  0     SAB  1   TAB      Ectopic      Multiple      Live Births               Home Medications    Prior to Admission medications   Medication Sig Start Date End Date Taking? Authorizing Provider  amLODipine (NORVASC) 10 MG tablet Take 10 mg by mouth daily.    [provider]  aspirin EC 81 MG tablet Take 81 mg by mouth daily.    [provider]  cephALEXin (KEFLEX) 500 MG capsule Take 1 capsule (500 mg total) by mouth 4 (four) times daily. 04/17/19   Darr, Veryl Speak, PA-C  meclizine (ANTIVERT) 25 MG tablet Take 1 tablet (25 mg total) by mouth 3 (three) times daily as needed for dizziness. 04/20/19   Long, Arlyss Repress, MD  Melatonin 5 MG TABS Take 5 mg by mouth See admin instructions. Every other night    [provider]  ondansetron (ZOFRAN ODT) 8 MG disintegrating tablet Take 1 tablet (8 mg total) by mouth every 8 (eight) hours as needed for nausea or vomiting. 04/17/19   Darr, Veryl Speak, PA-C    Family History Family History  Problem Relation Age of Onset  . Healthy Mother     Social History Social History   Tobacco Use  . Smoking status: Former Smoker    Quit  date: 01/18/2017    Years since quitting: 2.7  . Smokeless tobacco: Never Used  Substance Use Topics  . Alcohol use: No  . Drug use: No     Allergies   Pear and Percocet [oxycodone-acetaminophen]   Review of Systems Review of Systems  Respiratory: Positive for cough.   All other systems reviewed and are negative.    Physical Exam Triage Vital Signs ED Triage Vitals  Enc Vitals Group     BP 10/28/19 1412 129/79     Pulse Rate 10/28/19 1412 (!) 107     Resp 10/28/19 1412 20     Temp 10/28/19 1412 98.9 F (37.2 C)     Temp Source 10/28/19 1412 Oral     SpO2 10/28/19 1412 100 %     Weight --      Height --      Head Circumference --      Peak Flow --  Pain Score 10/28/19 1415 2     Pain Loc --      Pain Edu? --      Excl. in GC? --    No data found.  Updated Vital Signs BP 129/79 (BP Location: Left Arm)   Pulse (!) 107   Temp 98.9 F (37.2 C) (Oral)   Resp 20   SpO2 100%   Visual Acuity Right Eye Distance:   Left Eye Distance:   Bilateral Distance:    Right Eye Near:   Left Eye Near:    Bilateral Near:     Physical Exam Vitals and nursing note reviewed.  Constitutional:      Appearance: She is well-developed.  HENT:     Head: Normocephalic.     Mouth/Throat:     Mouth: Mucous membranes are moist.  Cardiovascular:     Rate and Rhythm: Normal rate.  Pulmonary:     Effort: Pulmonary effort is normal.  Abdominal:     General: There is no distension.  Musculoskeletal:        General: Normal range of motion.     Cervical back: Normal range of motion.  Neurological:     Mental Status: She is alert and oriented to person, place, and time.  Psychiatric:        Mood and Affect: Mood normal.      UC Treatments / Results  Labs (all labs ordered are listed, but only abnormal results are displayed) Labs Reviewed  SARS CORONAVIRUS 2 (TAT 6-24 HRS)    EKG   Radiology No results found.  Procedures Procedures (including critical care  time)  Medications Ordered in UC Medications - No data to display  Initial Impression / Assessment and Plan / UC Course  I have reviewed the triage vital signs and the nursing notes.  Pertinent labs & imaging results that were available during my care of the patient were reviewed by me and considered in my medical decision making (see chart for details).     MDM:  Illness sounds viral covid test ordered.   Final Clinical Impressions(s) / UC Diagnoses   Final diagnoses:  Viral URI   Discharge Instructions   None    ED Prescriptions    None     PDMP not reviewed this encounter.  An After Visit Summary was printed and given to the patient.   Elson Areas, [place] 10/28/19 1427

## 2019-10-28 NOTE — ED Triage Notes (Signed)
Pt presents for covid testing after having a cold the past few days.

## 2019-10-28 NOTE — ED Notes (Signed)
Patient able to ambulate independently  

## 2019-10-29 LAB — SARS CORONAVIRUS 2 (TAT 6-24 HRS): SARS Coronavirus 2: POSITIVE — AB

## 2020-06-07 ENCOUNTER — Encounter: Payer: Self-pay | Admitting: Family Medicine

## 2020-06-07 ENCOUNTER — Ambulatory Visit (INDEPENDENT_AMBULATORY_CARE_PROVIDER_SITE_OTHER): Payer: Medicaid Other | Admitting: Family Medicine

## 2020-06-07 ENCOUNTER — Other Ambulatory Visit: Payer: Self-pay

## 2020-06-07 VITALS — BP 128/86 | HR 98 | Ht 63.0 in | Wt 298.8 lb

## 2020-06-07 DIAGNOSIS — I10 Essential (primary) hypertension: Secondary | ICD-10-CM

## 2020-06-07 DIAGNOSIS — F339 Major depressive disorder, recurrent, unspecified: Secondary | ICD-10-CM | POA: Diagnosis not present

## 2020-06-07 DIAGNOSIS — Z0000 Encounter for general adult medical examination without abnormal findings: Secondary | ICD-10-CM

## 2020-06-07 LAB — POCT GLYCOSYLATED HEMOGLOBIN (HGB A1C): Hemoglobin A1C: 5.2 % (ref 4.0–5.6)

## 2020-06-07 NOTE — Patient Instructions (Addendum)
It was great seeing you today!  Thank you for choosing our clinic. I am glad that your mood has improved, please do some of the things that we discussed. Surrounding yourself by family and engaging in your hobbies can help too. You will need to come in for a PAP smear, please schedule this for your convenience.   Please remember to try to eat a healthy diet and exercise at least 3 times a day. Please try to eat all 3 meals in a day. Your blood pressure looks good today so I do not want to start any medications at this time.   Please follow up in 2 weeks for your next scheduled appointment, if anything arises between now and then, please don't hesitate to contact our office.    Thank you for allowing [place] to be a part of your medical care!  Thank you, Dr. Robyne Peers  Psychiatry Resource List (Adults and Children) Most of these providers will take Medicaid. please consult your insurance for a complete and updated list of available providers. When calling to make an appointment have your insurance information available to confirm you are covered.   [facility]Psychiatry and Counseling [address] [place], [place] [address] [phone]  [facility]  [address] [place], [place] [phone] Crisis [phone]   [facility]:   [facility]: [address]     [phone]   [facility]: [address] [place]. [place],        [phone] [place]: [address] Suite [phone],    [phone] [place]: [phone] [address],                   [phone] Children: [facility] [address]         [phone]   [facility]  (Psychiatry only; Adults /children 12 and over, will take Medicaid)  [address] Laurell Josephs [address], [place], [place] [address]       [phone]   [facility] (Psychiatry & counseling ; adults & children ; will take Medicaid [address]  [place] [address]  Go on-line to complete referral ( [email or web address]://[email or web address].[email or web address]/en/make-[email or web address]-referral 6187742182    (Spanish speaking therapists)  [facility]  Psychiatry & counseling; Adults and children;  Call Registration prior to scheduling an appointment [phone] [address]    [place], [place] [address]    [phone]  [facility] (Psychiatry & counseling; adults & children; Medicare no Medicaid)  [address] [place] [address]   [place], [place]  [address]      [phone]    [facility] (up to age 38)  Psychiatry & counseling ,will take Medicaid, must do counseling to receive psychiatry services  [address] [place] [address]        [phone]  [facility] (Psychiatry & counseling; adults & children; will take Medicaid) Will need a referral from provider [address],  [place], [place]  [phone]   [facility] --- Walk-In Mon-Friday 8am-3pm ( will take Medicaid, Psychiatry, Adults & children,  [address] [place], [place]   [phone]   [facility] [place], Walk-in M-F 8am-12pm and 1pm -3pm   (Counseling, Psychiatry, will take Medicaid, adults & children)  [address] [place], [place]  [phone]

## 2020-06-08 DIAGNOSIS — I10 Essential (primary) hypertension: Secondary | ICD-10-CM | POA: Insufficient documentation

## 2020-06-08 DIAGNOSIS — F339 Major depressive disorder, recurrent, unspecified: Secondary | ICD-10-CM | POA: Insufficient documentation

## 2020-06-08 LAB — HEPATITIS C ANTIBODY: Hep C Virus Ab: <0.1 s/co ratio (ref 0.0–0.9)

## 2020-06-08 LAB — BASIC METABOLIC PANEL
BUN/Creatinine Ratio: 11 (ref 9–23)
BUN: 11 mg/dL (ref 6–20)
CO2: 19 mmol/L — ABNORMAL LOW (ref 20–29)
Calcium: 9.1 mg/dL (ref 8.7–10.2)
Chloride: 105 mmol/L (ref 96–106)
Creatinine, Ser: 0.97 mg/dL (ref 0.57–1.00)
Glucose: 89 mg/dL (ref 65–99)
Potassium: 4.2 mmol/L (ref 3.5–5.2)
Sodium: 141 mmol/L (ref 134–144)
eGFR: 83 mL/min/{1.73_m2} (ref >59)

## 2020-06-08 LAB — LIPID PANEL
Chol/HDL Ratio: 4.1 ratio (ref 0.0–4.4)
Cholesterol, Total: 202 mg/dL — ABNORMAL HIGH (ref 100–199)
HDL: 49 mg/dL (ref >39)
LDL Chol Calc (NIH): 117 mg/dL — ABNORMAL HIGH (ref 0–99)
Triglycerides: 209 mg/dL — ABNORMAL HIGH (ref 0–149)
VLDL Cholesterol Cal: 36 mg/dL (ref 5–40)

## 2020-06-08 LAB — HIV ANTIBODY (ROUTINE TESTING W REFLEX): HIV Screen 4th Generation wRfx: NONREACTIVE
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
## 2020-06-08 NOTE — Assessment & Plan Note (Signed)
-  PHQ-9 score of 16 with negative question 9 reviewed and discussed -list of therapists and psychiatrists provided -coping strategies and reassurance provided -f/u mood check in 2 weeks

## 2020-06-08 NOTE — Assessment & Plan Note (Signed)
-  BP 128/86 in the office -no pharmacologic intervention at this time given BP is at goal -diet and exercise counseling  -reassurance provided

## 2020-06-15 ENCOUNTER — Ambulatory Visit (INDEPENDENT_AMBULATORY_CARE_PROVIDER_SITE_OTHER): Payer: Medicaid Other | Admitting: Family Medicine

## 2020-06-15 ENCOUNTER — Other Ambulatory Visit: Payer: Self-pay

## 2020-06-15 ENCOUNTER — Other Ambulatory Visit (HOSPITAL_COMMUNITY)
Admission: RE | Admit: 2020-06-15 | Discharge: 2020-06-15 | Disposition: A | Discharge status: Home / Self Care | Payer: Medicaid Other | Source: Ambulatory Visit | Attending: Family Medicine | Admitting: Family Medicine

## 2020-06-15 ENCOUNTER — Encounter: Payer: Self-pay | Admitting: Family Medicine

## 2020-06-15 VITALS — BP 128/84 | HR 92 | Ht 63.0 in | Wt 299.4 lb

## 2020-06-15 DIAGNOSIS — Z124 Encounter for screening for malignant neoplasm of cervix: Secondary | ICD-10-CM | POA: Insufficient documentation

## 2020-06-15 DIAGNOSIS — F339 Major depressive disorder, recurrent, unspecified: Secondary | ICD-10-CM | POA: Diagnosis present

## 2020-06-15 MED ORDER — SERTRALINE HCL 50 MG PO TABS: 50.0000 mg | ORAL_TABLET | Freq: Every day | ORAL | 3 refills | Status: DC

## 2020-06-15 NOTE — Patient Instructions (Addendum)
It was great seeing you today!  Today we discussed your mood, please make an appointment with the therapist. I have started you on zoloft 50 mg, please take this once a day.  We also performed a PAP smear today, I will let you know of any abnormal results.  Please follow up in 2 weeks for your next scheduled appointment, if anything arises between now and then, please don't hesitate to contact our office.    Thank you for allowing [place] to be a part of your medical care!  Thank you, Dr. Robyne Peers   Therapy and Counseling Resources Most providers on this list will take Medicaid. Patients with commercial insurance or Medicare should contact their insurance company to get a list of in network providers.  [facility]:Psychiatry and Counseling [address] [place], [place] [address] [phone]  [facility]  [address], Suite [place], [place] [address]      [phone]  [facility] [address]  [place], [place] [address] [phone]  [facility] [address]  [place], [place] [address]       [phone]      Jovita Kussmaul [facility] [address] [place], [place] [phone]  [facility]:  [address] [place] [phone]  [facility]:  [address] [place]iener [phone]  [facility] (under & uninsured) [address]   [place] [phone]    [email or web address]    [facility] [address] . [place]    [phone]  [facility] -[address]     Phone:  [phone]     [facility]  [address]  [phone]   [facility] [address]      [place], [place] [phone]  [facility]: [address] [place], [place], [place]  [phone]   [facility] (Spanish therapist)  [email or web address]  [address] [place]  [address]   [place] [address]    [phone]    [facility]   [address],  [place], [place]  [phone]   [facility]  [address] [place]  [phone]  [facility]  [address] [place], [place]        [phone]  Open Access/Walk In Clinic under & uninsured  [facility]  [address] [place], [place] [phone] Crisis [phone]  [facility] [place],  (Spanish)   [address], [place]: [phone]) 8:30 - 12; 1 - 2:30  [facility] [employer],  [address] [place], [place]    [phone]):8:30 - 12; 2 - 3PM  [facility] [employer],  [address],  [place]; [phone]):   Mon - Fri 8 AM - 5 PM  [facility] [address] [place]  MWF 12:30 to 3:00 or call to schedule an appointment  [phone]  Specific Provider options Psychology Today  [email or web address].psychologytoday.com/us 1. click on find a therapist  2. enter your zip code 3. left side and select or tailor a therapist for your specific need.   [facility] Provider Directory [email or web address]  (Medicaid)   Follow all drop down to find a provider  Social Support program [facility] [place] [phone] or PhotoSolver.pl [address], [place], [place] Recovery support and educational   24- Hour Availability:  .  Marland Kitchen [facility]  . [address] [place], [place] [employer] [phone] Crisis [phone]  . [facility] [phone]  [facility] Service  [phone]   . [facility] [employer]  [phone] (after hours)  . Therapeutic Alternative/Mobile Crisis   [phone]  . [place] National Suicide Hotline  909-417-2942 (TALK)  . Call 911 or go to emergency  room  . [employer]  [phone]);  [place] and [employer]   . [facility]ESS  [phone]); [place], [place], [place], [place], [place], [place], [place]

## 2020-06-15 NOTE — Assessment & Plan Note (Signed)
-  started zoloft 50 mg daily, med instructions provided  -list of therapists provided again, encouraged to make appointment with therapist -reassurance provided  -f/u in 2 weeks given started on SSRI

## 2020-06-15 NOTE — Progress Notes (Signed)
    SUBJECTIVE:   CHIEF COMPLAINT / HPI:   Encounter for PAP Patient presents as she is due for PAP smear. No recent PAP smear noted per chart review. Denies dysuria, vaginal discharge, pruritus, rash, recent illness, fever, chills and pelvic pain. Currently sexually active with one female partner but has not been since our last visit in 2 weeks. Reported at that time that she does not use protection during each encounter. Reports she is currently menstruating. Nexplanon for contraception placed in 2019 following delivery of daughter, will need replacement or another form of birth control around July 2022.    PERTINENT  PMH / PSH:   Depression History of depression, states that her mood has been relatively the same as last visit. Feels down or depressed at least 3 times a week. Recently had a falling out with her sister who lost her father. Patient has been trying to comfort her but this has been difficult as they are not on speaking terms at this time. Feels safe at home. Strong support system in place, she is very close to her mother. Appetite has improved, she now has 2 meals a day. She has reviewed the list of therapists provided at last visit and chose someone, she has not made an appointment yet.   OBJECTIVE:   BP 128/84   Pulse 92   Ht 5\' 3"  (1.6 m)   Wt 299 lb 6.4 oz (135.8 kg)   SpO2 98%   BMI 53.04 kg/m   General: Patient well-appearing, in no acute distress. CV: RRR Resp: CTAB, no wheezing Abdomen: soft, nontender, presence of active bowel sounds GU: no rashes or external lesions noted, no vaginal or cervical discharge noted, mild vaginal bleeding noted, normal uterus size, no adnexal masses or tenderness noted Neuro: normal gait, exhibits logical and tangential thinking process Psych: mood appropriate, denies SI  GU and bimanual exam performed in the presence of chaperone.   ASSESSMENT/PLAN:   Encounter for Papanicolaou smear of cervix -pending PAP results along with  GC/Chylalydia and Trichomonas, discussed that I will notify patient of any abnormal results  -counseling of safe sex practices provided  Depression, recurrent (HCC) -started zoloft 50 mg daily, med instructions provided  -list of therapists provided again, encouraged to make appointment with therapist -reassurance provided  -f/u in 2 weeks given started on SSRI   Discussed importance of COVID vaccination. Patient respectfully declines at this time and defers to possibly next visit.   , DO [place] [facility]

## 2020-06-15 NOTE — Assessment & Plan Note (Signed)
-  pending PAP results along with GC/Chylalydia and Trichomonas, discussed that I will notify patient of any abnormal results  -counseling of safe sex practices provided

## 2020-06-21 LAB — CYTOLOGY - PAP
Chlamydia: NEGATIVE
Comment: NEGATIVE
Comment: NEGATIVE
Comment: NEGATIVE
Comment: NORMAL
Diagnosis: UNDETERMINED — AB
High risk HPV: NEGATIVE
Neisseria Gonorrhea: NEGATIVE
Trichomonas: NEGATIVE

## 2020-07-18 ENCOUNTER — Ambulatory Visit: Payer: Medicaid Other | Admitting: Family Medicine

## 2020-08-12 ENCOUNTER — Ambulatory Visit: Payer: Medicaid Other | Admitting: Family Medicine

## 2020-10-04 ENCOUNTER — Ambulatory Visit: Payer: Medicaid Other | Admitting: Family Medicine

## 2020-11-07 DIAGNOSIS — Z1152 Encounter for screening for COVID-19: Secondary | ICD-10-CM | POA: Diagnosis not present

## 2020-11-11 ENCOUNTER — Telehealth: Payer: Self-pay

## 2020-11-11 NOTE — Telephone Encounter (Signed)
Received phone call from [facility] research study regarding survey that patient had completed. Reports that patient answered yes to "having thoughts of taking life in the last thirty days, even if you would not actually do it"  Denies active SI. Patient was provided with emergency resources.   Patient has follow up appointment with PCP on 9/12.  Veronda Prude, RN

## 2020-11-28 ENCOUNTER — Other Ambulatory Visit: Payer: Self-pay

## 2020-11-28 ENCOUNTER — Ambulatory Visit: Payer: Medicaid Other | Admitting: Family Medicine

## 2020-11-28 DIAGNOSIS — Z1152 Encounter for screening for COVID-19: Secondary | ICD-10-CM | POA: Diagnosis not present

## 2020-12-06 DIAGNOSIS — Z1152 Encounter for screening for COVID-19: Secondary | ICD-10-CM | POA: Diagnosis not present

## 2020-12-16 DIAGNOSIS — Z1152 Encounter for screening for COVID-19: Secondary | ICD-10-CM | POA: Diagnosis not present

## 2020-12-22 DIAGNOSIS — Z1152 Encounter for screening for COVID-19: Secondary | ICD-10-CM | POA: Diagnosis not present

## 2020-12-30 DIAGNOSIS — Z1152 Encounter for screening for COVID-19: Secondary | ICD-10-CM | POA: Diagnosis not present

## 2021-01-03 ENCOUNTER — Encounter: Payer: Self-pay | Admitting: Family Medicine

## 2021-01-03 ENCOUNTER — Other Ambulatory Visit: Payer: Self-pay

## 2021-01-03 ENCOUNTER — Ambulatory Visit (INDEPENDENT_AMBULATORY_CARE_PROVIDER_SITE_OTHER): Payer: Medicaid Other | Admitting: Family Medicine

## 2021-01-03 VITALS — BP 136/87 | HR 74 | Ht 63.0 in | Wt 277.4 lb

## 2021-01-03 DIAGNOSIS — F339 Major depressive disorder, recurrent, unspecified: Secondary | ICD-10-CM

## 2021-01-03 DIAGNOSIS — Z309 Encounter for contraceptive management, unspecified: Secondary | ICD-10-CM

## 2021-01-03 DIAGNOSIS — Z3046 Encounter for surveillance of implantable subdermal contraceptive: Secondary | ICD-10-CM | POA: Diagnosis not present

## 2021-01-03 LAB — POCT URINE PREGNANCY: Preg Test, Ur: NEGATIVE

## 2021-01-03 NOTE — Patient Instructions (Addendum)
It was great seeing you today!  Today we removed your nexplanon. Please remember to use protection with each encounter. If you decide on another mode of contraception, please see [place] so we can discuss further.   I am glad that your mood has improved, please see the list of therapists below. Pick one to establish care with at your earliest convenience.  Please follow up at your next scheduled appointment, if anything arises between now and then, please don't hesitate to contact our office.   Thank you for allowing [place] to be a part of your medical care!  Thank you, Dr. Robyne Peers    Therapy and Counseling Resources Most providers on this list will take Medicaid. Patients with commercial insurance or Medicare should contact their insurance company to get a list of in network providers.  [facility]Psychiatry and Counseling [address] [place], [place] [address] [phone]  [facility]  [address] Suite [place], [place] [address]      [phone]  [facility] [address]  [place], [place] [address] [phone]  [facility] [address]  [place], [place] [address]       [phone]     [facility] (virtual only) [phone]  Jovita Kussmaul [facility] [address], [place], [place] [phone]  [facility]:  [address] [place] [phone]  [facility]:  [address] Hess[address] Diener [phone]  [facility] (under & uninsured) [address]   [place] [phone]    [email or web address]    [facility] [address]  [place]    [phone]  [facility] -[address]     Phone:  [phone]     [facility]-  [address]  [phone]   [facility] [address]      [place], [place] [phone] ext  [phone]  [facility]: [address] [place], [place], [place]  [phone]   [facility] (Spanish therapist) [email or web address]  [address] [place]  [address]   [place] [address]    [phone]    [facility]   [address],  [place], [place]  [phone]   [facility]  [address] [place]  [phone]  [facility]  [address] [place], [place]        [phone]  Open Access/Walk In Clinic under & uninsured  [facility]  [address] [place], [place] [phone] Crisis [phone]  [facility] [place],  (Spanish)   [address], [place]: [phone]) 8:30 - 12; 1 - 2:30  [facility] [employer],  [address] [place], [place]    [phone]):8:30 - 12; 2 - 3PM  [facility] [employer],  [address]  [place]; [phone]):   Mon - Fri 8 AM - 5 PM  [facility] [address] [place]  MWF 12:30 to 3:00 or call to schedule an appointment  [phone]  Specific Provider options Psychology Today  [email or web address] click on find a therapist  enter your zip code left side and select or tailor a therapist for your specific need.   [facility] Provider Directory [email or web address]  (Medicaid)   Follow all drop down to find a provider  Social Support program [facility] [place] or [employer] [address], [place], [place] Recovery support and educational   24- Hour Availability:   [facility]  [address] [place], [place] [phone] Crisis [phone]  [facility] [phone]  [facility]  [phone]   [facility]  [phone] (after hours)  Therapeutic [facility]/Mobile Crisis    [phone]  [place] National Suicide Hotline  [phone] Len Childs)  Call 911 or go to emergency room  [facility]  [phone]);  [place] and [employer]  [phone]); [place], [place], [place], [place], [place], [place], [place]

## 2021-01-04 DIAGNOSIS — Z975 Presence of (intrauterine) contraceptive device: Secondary | ICD-10-CM | POA: Insufficient documentation

## 2021-01-04 DIAGNOSIS — Z3046 Encounter for surveillance of implantable subdermal contraceptive: Secondary | ICD-10-CM | POA: Insufficient documentation

## 2021-01-04 NOTE — Progress Notes (Signed)
    SUBJECTIVE:   CHIEF COMPLAINT / HPI:   Patient presents desiring to remove nexplanon that was placed almost 3 years ago after her daughter was born. Nexplanon currently in the left arm. She does not desire reinsertion or any other form of contraception. She states that she is currently sexually active and uses protection with each encounter. Sexually active with 1 female partner. At this time, patient only wants to use barrier protection as mode of contraception even after additional form of contraception encouraged.   PERTINENT  PMH / PSH:   Depression Recurrent, patient states that she is compliant on zoloft. Tolerating this medication well without complications. Reports that she feels it has been helping her and she is in a good mood most of the time with occasional lows. Her mother is a significant part of her support system. She is noticed that she is doing better than at previous visits. Denies SI or seeing a therapist but would is open to seeing a therapist at this time.   OBJECTIVE:   BP 136/87   Pulse 74   Ht 5\' 3"  (1.6 m)   Wt 277 lb 6.4 oz (125.8 kg)   SpO2 100%   BMI 49.14 kg/m   General: Patient well-appearing, in no acute distress. CV: RRR, no murmurs or gallops auscultated Resp: CTAB Abdomen: soft, nontender, presence of bowel sounds Ext: radial pulses strong and equal bilaterally, presence of nexplanon implant within left arm noted upon deep palpation Psych: mood appropriate, pleasant, denies SI  ASSESSMENT/PLAN:   Encounter for Nexplanon removal -nexplanon removed without complications, patient tolerated removal well -encouraged use of barrier protection and encouraged use of additional contraception, instructed patient to contact office when she is ready for additional birth control  --Upreg negative   Depression, recurrent (HCC) -PHQ-9 reviewed and discussed, 0 for question 9 -continue zoloft -list of therapists provided, encouraged to establish care at her  earliest convenience -reassurance provided    PROCEDURE NOTE: NEXPLANON  REMOVAL Patient given informed consent and signed copy in the chart. Left arm area prepped and draped in the usual sterile fashion. Three cc of lidocaine without epinephrine 1% used for local anesthesia. A small stab incision was made close to the nexplanon with scalpel. Hemostats were used to withdraw the nexplanon. A small bandage was applied over a steri strip  No complications.Patient given follow up instructions should she experience redness, swelling at sight or fever in the next 24 hours. Patient was reminded this totally removes her nexplanon contraceptive devise. (she can now potentially conceive)   , DO [facility]

## 2021-01-04 NOTE — Assessment & Plan Note (Signed)
-  nexplanon removed without complications, patient tolerated removal well -encouraged use of barrier protection and encouraged use of additional contraception, instructed patient to contact office when she is ready for additional birth control  --Upreg negative

## 2021-01-04 NOTE — Assessment & Plan Note (Signed)
-  PHQ-9 reviewed and discussed, 0 for question 9 -continue zoloft -list of therapists provided, encouraged to establish care at her earliest convenience -reassurance provided

## 2021-01-09 ENCOUNTER — Ambulatory Visit: Payer: Medicaid Other | Admitting: Family Medicine

## 2021-02-01 DIAGNOSIS — Z1152 Encounter for screening for COVID-19: Secondary | ICD-10-CM | POA: Diagnosis not present

## 2021-02-16 DIAGNOSIS — Z1152 Encounter for screening for COVID-19: Secondary | ICD-10-CM | POA: Diagnosis not present

## 2021-06-17 DIAGNOSIS — Z1152 Encounter for screening for COVID-19: Secondary | ICD-10-CM | POA: Diagnosis not present

## 2021-06-23 DIAGNOSIS — Z1152 Encounter for screening for COVID-19: Secondary | ICD-10-CM | POA: Diagnosis not present

## 2021-07-02 DIAGNOSIS — Z1152 Encounter for screening for COVID-19: Secondary | ICD-10-CM | POA: Diagnosis not present

## 2021-07-07 DIAGNOSIS — Z1152 Encounter for screening for COVID-19: Secondary | ICD-10-CM | POA: Diagnosis not present

## 2021-07-14 DIAGNOSIS — Z1152 Encounter for screening for COVID-19: Secondary | ICD-10-CM | POA: Diagnosis not present

## 2021-07-21 DIAGNOSIS — Z1152 Encounter for screening for COVID-19: Secondary | ICD-10-CM | POA: Diagnosis not present

## 2021-07-30 DIAGNOSIS — Z1152 Encounter for screening for COVID-19: Secondary | ICD-10-CM | POA: Diagnosis not present

## 2021-08-05 DIAGNOSIS — Z1152 Encounter for screening for COVID-19: Secondary | ICD-10-CM | POA: Diagnosis not present

## 2021-08-11 DIAGNOSIS — Z1152 Encounter for screening for COVID-19: Secondary | ICD-10-CM | POA: Diagnosis not present

## 2021-08-22 ENCOUNTER — Encounter: Payer: Self-pay | Admitting: *Deleted

## 2021-08-27 DIAGNOSIS — Z1152 Encounter for screening for COVID-19: Secondary | ICD-10-CM | POA: Diagnosis not present

## 2021-08-28 DIAGNOSIS — Z1152 Encounter for screening for COVID-19: Secondary | ICD-10-CM | POA: Diagnosis not present

## 2021-09-01 ENCOUNTER — Ambulatory Visit: Payer: Medicaid Other | Admitting: Student

## 2021-09-06 ENCOUNTER — Other Ambulatory Visit: Payer: Self-pay | Admitting: Student

## 2021-09-06 ENCOUNTER — Ambulatory Visit (INDEPENDENT_AMBULATORY_CARE_PROVIDER_SITE_OTHER): Payer: Medicaid Other | Admitting: Student

## 2021-09-06 ENCOUNTER — Encounter: Payer: Self-pay | Admitting: Student

## 2021-09-06 VITALS — BP 120/70

## 2021-09-06 DIAGNOSIS — Z202 Contact with and (suspected) exposure to infections with a predominantly sexual mode of transmission: Secondary | ICD-10-CM

## 2021-09-06 DIAGNOSIS — F339 Major depressive disorder, recurrent, unspecified: Secondary | ICD-10-CM | POA: Diagnosis not present

## 2021-09-06 DIAGNOSIS — Z3009 Encounter for other general counseling and advice on contraception: Secondary | ICD-10-CM

## 2021-09-06 DIAGNOSIS — I10 Essential (primary) hypertension: Secondary | ICD-10-CM | POA: Diagnosis not present

## 2021-09-06 DIAGNOSIS — Z789 Other specified health status: Secondary | ICD-10-CM | POA: Diagnosis not present

## 2021-09-06 LAB — POCT URINE PREGNANCY: Preg Test, Ur: NEGATIVE

## 2021-09-06 MED ORDER — LEVONORGESTREL 1.5 MG PO TABS: 1.5000 mg | ORAL_TABLET | Freq: Once | ORAL | 0 refills | Status: DC

## 2021-09-06 MED ORDER — NORETHIN ACE-ETH ESTRAD-FE 1-20 MG-MCG(24) PO CAPS: 1.0000 | ORAL_CAPSULE | Freq: Every day | ORAL | 0 refills | Status: DC

## 2021-09-06 NOTE — Patient Instructions (Addendum)
It was great to see you today! Thank you for choosing [facility] for your primary care. Jamie Boone was seen for follow up.  Today we addressed: Getting STI testing and will order oral birth control to your pharmacy to take daily until Nexplanon is placed Continue with condoms as well    Orders Placed This Encounter  Procedures   RPR   HIV antibody (with reflex)   POCT urine pregnancy   Meds ordered this encounter  Medications   Norethin Ace-Eth Estrad-FE 1-20 MG-MCG(24) CAPS    Sig: Take 1 tablet by mouth daily.    Dispense:  28 capsule    Refill:  0   levonorgestrel (PLAN B 1-STEP) 1.5 MG tablet    Sig: Take 1 tablet (1.5 mg total) by mouth once for 1 dose.    Dispense:  1 tablet    Refill:  0    If you haven't already, sign up for My Chart to have easy access to your labs results, and communication with your primary care physician.  We are checking some labs today. If they are abnormal, I will call you. If they are normal, I will send you a MyChart message (if it is active) or a letter in the mail. If you do not hear about your labs in the next 2 weeks, please call the office.   You should return to our clinic Return in about 6 days (around 09/12/2021) for Nexplanon.  I recommend that you always bring your medications to each appointment as this makes it easy to ensure you are on the correct medications and helps [place] not miss refills when you need them.  Please arrive 15 minutes before your appointment to ensure smooth check in process.  We appreciate your efforts in making this happen.  Please call the clinic at [phone] if your symptoms worsen or you have any concerns.  Thank you for allowing me to participate in your care, Mansi Tokar [employer]

## 2021-09-06 NOTE — Progress Notes (Signed)
  SUBJECTIVE:   CHIEF COMPLAINT / HPI:   HTN: BP: 120/70 today. Home medications include: Amlodipine but not taking that anymore. She does not endorse taking these medications as prescribed. Does not check blood pressure at home. Denies any SOB, CP, vision changes, LE edema, medication SEs, or symptoms of hypotension. Diet not eating as much as she used to but hard to lose weight. Exercise usually by walking.  Most recent creatinine trend:  Lab Results  Component Value Date   CREATININE 0.97 06/07/2020   CREATININE 1.10 (H) 04/20/2019   CREATININE 0.82 02/25/2017   Patient has not had a BMP in the past 1 year.  Birth Control: Patient desires BC. Has had depo and Nexplanon in the past. She notes that depo made her gain weight and Nexplanon got stuck in her arm. Last on [facility] a couple of months ago. No known migraine h/o or clotting disorder. Does have headaches and smoking history.  - No previous STIs in the past, unsure of testing  - preferred gender of partner: 1 female and 1 female - Sexually active with 2 partner(s) - Last sexual encounter: 1 day ago - Contraception: Condoms- consistently  Symptoms - Abnormal vaginal discharge: None  - Missed period: None  - Fever: None  - Abdominal/Pelvic pain: None  - Vaginal bleeding: None  - Pain during sex: None  - Rash: none   PERTINENT  PMH / PSH:   Past Medical History:  Diagnosis Date   Hypertension    Pregnancy induced hypertension     OBJECTIVE:  BP 120/70   SpO2 99%   General: NAD, pleasant, able to participate in exam; pleasant AA F Cardiac: RRR, no murmurs auscultated Respiratory: CTAB, normal WOB Abdomen: soft, non-tender, non-distended, normoactive bowel sounds Extremities: warm and well perfused, no edema or cyanosis Skin: warm and dry, no rashes noted Neuro: alert, no obvious focal deficits, speech normal Psych: Normal affect and mood  ASSESSMENT/PLAN:  Depression, recurrent (HCC) Stable, not taking Sertraline.  Will reassess mood at next visit.   Hypertension Stable at 120/70, no need for medication at this time.   Birth control counseling Requests BC, discussed and would like to continue with Nexplanon. STI testing ordered. Upreg negative. COC given until Nexplanon placement, no absolute contraindications to COC. Continue with condoms.    Orders Placed This Encounter  Procedures   RPR    Standing Status:   Future    Standing Expiration Date:   09/07/2022   HIV antibody (with reflex)    Standing Status:   Future    Standing Expiration Date:   09/07/2022   POCT urine pregnancy   Meds ordered this encounter  Medications   Norethin Ace-Eth Estrad-FE 1-20 MG-MCG(24) CAPS    Sig: Take 1 tablet by mouth daily.    Dispense:  28 capsule    Refill:  0   levonorgestrel (PLAN B 1-STEP) 1.5 MG tablet    Sig: Take 1 tablet (1.5 mg total) by mouth once for 1 dose.    Dispense:  1 tablet    Refill:  0   Return in about 6 days (around 09/12/2021) for Nexplanon.  Alfredo Martinez, MD

## 2021-09-06 NOTE — Assessment & Plan Note (Signed)
Stable, not taking Sertraline. Will reassess mood at next visit.

## 2021-09-06 NOTE — Assessment & Plan Note (Addendum)
Requests BC, discussed and would like to continue with Nexplanon. STI testing ordered. Upreg negative. COC given until Nexplanon placement, no absolute contraindications to COC. Continue with condoms.

## 2021-09-06 NOTE — Assessment & Plan Note (Signed)
Stable at 120/70, no need for medication at this time.

## 2021-09-12 ENCOUNTER — Ambulatory Visit: Payer: Medicaid Other | Admitting: Student

## 2021-09-12 NOTE — Progress Notes (Deleted)
    Nexplanon Insertion Jamie Boone is a 26 y.o. G***P*** who desires LARC with Nexplanon.    Physical Exam There were no vitals filed for this visit. Gen: Awake, alert, pleasant, appears stated age  Resp: Breathing comfortably on room air, normal effort Skin: No lesions or rashes. Warm and dry. No edema  Psych: Normal affect and mood    PROCEDURE NOTE  Nexplanon Insertion Procedure Patient identified, informed consent performed, consent signed. Patient does understand that irregular bleeding is a very common side effect of this medication. She was advised to have backup contraception for one week after placement. Appropriate time out taken.  Patient's left arm was prepped and draped in the usual sterile fashion. The ruler used to measure and mark insertion area.  Patient was prepped with alcohol swab and then injected with 5 ml of 1% lidocaine.  She was prepped with betadine, Nexplanon removed from packaging,  Device confirmed in needle, then inserted full length of needle and withdrawn per handbook instructions. Nexplanon was able to palpated in the patient's arm; patient palpated the insert herself. There was minimal blood loss.  Patient insertion site covered with guaze and a pressure bandage to reduce any bruising. The patient tolerated the procedure well and was given post procedure instructions.    Alfredo Martinez MD

## 2021-09-13 ENCOUNTER — Encounter: Payer: Self-pay | Admitting: Student

## 2021-09-13 ENCOUNTER — Ambulatory Visit (INDEPENDENT_AMBULATORY_CARE_PROVIDER_SITE_OTHER): Payer: Medicaid Other | Admitting: Student

## 2021-09-13 VITALS — BP 120/70 | HR 81 | Wt 288.0 lb

## 2021-09-13 DIAGNOSIS — Z202 Contact with and (suspected) exposure to infections with a predominantly sexual mode of transmission: Secondary | ICD-10-CM | POA: Diagnosis not present

## 2021-09-13 DIAGNOSIS — Z3046 Encounter for surveillance of implantable subdermal contraceptive: Secondary | ICD-10-CM | POA: Diagnosis not present

## 2021-09-13 DIAGNOSIS — Z30019 Encounter for initial prescription of contraceptives, unspecified: Secondary | ICD-10-CM | POA: Diagnosis not present

## 2021-09-13 LAB — POCT URINE PREGNANCY: Preg Test, Ur: NEGATIVE

## 2021-09-13 NOTE — Progress Notes (Signed)
    Nexplanon Insertion Jamie Boone is a 26 y.o. G2P1011 who desires LARC with Nexplanon.    Physical Exam Vitals:   09/13/21 1508  BP: 120/70  Pulse: 81  SpO2: 97%   Gen: Awake, alert, pleasant, appears stated age  Resp: Breathing comfortably on room air, normal effort Skin: No lesions or rashes. Warm and dry. No edema  Psych: Normal affect and mood    PROCEDURE NOTE  Nexplanon Insertion Procedure Patient identified, informed consent performed, consent signed. Patient does understand that irregular bleeding is a very common side effect of this medication. She was advised to have backup contraception for one week after placement. Appropriate time out taken.  Patient's left arm was prepped and draped in the usual sterile fashion. The ruler used to measure and mark insertion area.  Patient was prepped with alcohol swab and then injected with 5 ml of 1% lidocaine.  She was prepped with betadine, Nexplanon removed from packaging,  Device confirmed in needle, then inserted full length of needle and withdrawn per handbook instructions. Nexplanon was able to palpated in the patient's arm; patient palpated the insert herself. There was minimal blood loss.  Patient insertion site covered with guaze and a pressure bandage to reduce any bruising. The patient tolerated the procedure well and was given post procedure instructions.   She passively mentioned wanting to try losing weight. I encouraged this and informed her to follow up for a visit regarding her options for medications and nutrition counseling. Patient may benefit from having A1C checked as well as lipid panel to assess for metabolic syndrome.   She has an order for chlamydia and GC testing but could not provide another urine sample after Upreg, will need to obtain this at next visit.    Alfredo Martinez MD

## 2021-09-13 NOTE — Patient Instructions (Addendum)
It was great to see you today! Thank you for choosing [facility] for your primary care. Jamie Boone was seen for Nexplanon.  Today we placed a Nexplanon. Remember that irregular bleeding is a common side effect. You should use back-up contraception for 1 week. The Nexplanon will be good for 3 years. Remember that this is a very effective method to help prevent pregnancy but it does not protect against sexually transmitted infections. Barrier protection (such as condoms) can help to protect you against sexually transmitted infections.  Nexplanon Instructions After Insertion  Keep bandage clean and dry for 24 hours  May use ice/Tylenol/Ibuprofen for soreness or pain  If you develop fever, drainage or increased warmth from incision site-contact office immediately    Orders Placed This Encounter  Procedures   POCT urine pregnancy   No orders of the defined types were placed in this encounter.   If you haven't already, sign up for My Chart to have easy access to your labs results, and communication with your primary care physician.  We are checking some labs today. If they are abnormal, I will call you. If they are normal, I will send you a MyChart message (if it is active) or a letter in the mail. If you do not hear about your labs in the next 2 weeks, please call the office.   You should return to our clinic Return in about 1 month (around 10/13/2021), or if symptoms worsen or fail to improve, for f/u if you develop any symptoms after nexplanon .  I recommend that you always bring your medications to each appointment as this makes it easy to ensure you are on the correct medications and helps [place] not miss refills when you need them.  Please arrive 15 minutes before your appointment to ensure smooth check in process.  We appreciate your efforts in making this happen.  Please call the clinic at [phone] if your symptoms worsen or you have any concerns.  Thank you for  allowing me to participate in your care, Riti Rollyson [employer]

## 2021-09-14 LAB — HIV ANTIBODY (ROUTINE TESTING W REFLEX): HIV Screen 4th Generation wRfx: NONREACTIVE

## 2021-09-14 LAB — RPR: RPR Ser Ql: NONREACTIVE

## 2021-09-15 MED ORDER — ETONOGESTREL 68 MG ~~LOC~~ IMPL
68.0000 mg | DRUG_IMPLANT | Freq: Once | SUBCUTANEOUS | Status: AC
  Administered 2021-09-13: 68 mg via SUBCUTANEOUS

## 2021-09-15 NOTE — Addendum Note (Signed)
Addended by: Veronda Prude on: 09/15/2021 02:48 PM   Modules accepted: Orders

## 2021-09-21 ENCOUNTER — Other Ambulatory Visit: Payer: Self-pay | Admitting: Student

## 2021-09-21 DIAGNOSIS — Z789 Other specified health status: Secondary | ICD-10-CM

## 2021-09-22 DIAGNOSIS — Z1152 Encounter for screening for COVID-19: Secondary | ICD-10-CM | POA: Diagnosis not present

## 2021-09-26 NOTE — Progress Notes (Deleted)
    SUBJECTIVE:   CHIEF COMPLAINT / HPI:   ***  PERTINENT  PMH / PSH: ***  OBJECTIVE:   LMP 09/12/2021  ***  General: NAD, pleasant, able to participate in exam Cardiac: RRR, no murmurs. Respiratory: CTAB, normal effort, No wheezes, rales or rhonchi Abdomen: Bowel sounds present, nontender, nondistended, no hepatosplenomegaly. Extremities: no edema or cyanosis. Skin: warm and dry, no rashes noted Neuro: alert, no obvious focal deficits Psych: Normal affect and mood  ASSESSMENT/PLAN:   No problem-specific Assessment & Plan notes found for this encounter.     Dr. Erick Alley, DO [facility]    {    This will disappear when note is signed, click to select method of visit    :1}

## 2021-09-27 ENCOUNTER — Ambulatory Visit: Payer: Medicaid Other | Admitting: Student

## 2021-09-28 DIAGNOSIS — Z1152 Encounter for screening for COVID-19: Secondary | ICD-10-CM | POA: Diagnosis not present

## 2021-10-19 DIAGNOSIS — Z1152 Encounter for screening for COVID-19: Secondary | ICD-10-CM | POA: Diagnosis not present

## 2021-10-21 DIAGNOSIS — Z1152 Encounter for screening for COVID-19: Secondary | ICD-10-CM | POA: Diagnosis not present

## 2021-10-27 DIAGNOSIS — Z1152 Encounter for screening for COVID-19: Secondary | ICD-10-CM | POA: Diagnosis not present

## 2021-11-14 DIAGNOSIS — Z1152 Encounter for screening for COVID-19: Secondary | ICD-10-CM | POA: Diagnosis not present

## 2021-11-20 DIAGNOSIS — Z1152 Encounter for screening for COVID-19: Secondary | ICD-10-CM | POA: Diagnosis not present

## 2021-12-08 DIAGNOSIS — Z1152 Encounter for screening for COVID-19: Secondary | ICD-10-CM | POA: Diagnosis not present

## 2021-12-12 DIAGNOSIS — Z1152 Encounter for screening for COVID-19: Secondary | ICD-10-CM | POA: Diagnosis not present

## 2021-12-14 DIAGNOSIS — Z1152 Encounter for screening for COVID-19: Secondary | ICD-10-CM | POA: Diagnosis not present

## 2021-12-22 DIAGNOSIS — Z1152 Encounter for screening for COVID-19: Secondary | ICD-10-CM | POA: Diagnosis not present

## 2021-12-28 DIAGNOSIS — Z1152 Encounter for screening for COVID-19: Secondary | ICD-10-CM | POA: Diagnosis not present

## 2022-01-04 DIAGNOSIS — U071 COVID-19: Secondary | ICD-10-CM | POA: Diagnosis not present

## 2022-01-11 DIAGNOSIS — Z1152 Encounter for screening for COVID-19: Secondary | ICD-10-CM | POA: Diagnosis not present

## 2022-01-18 DIAGNOSIS — Z1152 Encounter for screening for COVID-19: Secondary | ICD-10-CM | POA: Diagnosis not present

## 2022-01-31 DIAGNOSIS — Z1152 Encounter for screening for COVID-19: Secondary | ICD-10-CM | POA: Diagnosis not present

## 2022-02-02 DIAGNOSIS — Z1152 Encounter for screening for COVID-19: Secondary | ICD-10-CM | POA: Diagnosis not present

## 2022-02-16 DIAGNOSIS — Z1152 Encounter for screening for COVID-19: Secondary | ICD-10-CM | POA: Diagnosis not present

## 2022-02-26 DIAGNOSIS — Z1152 Encounter for screening for COVID-19: Secondary | ICD-10-CM | POA: Diagnosis not present

## 2022-03-02 DIAGNOSIS — Z1152 Encounter for screening for COVID-19: Secondary | ICD-10-CM | POA: Diagnosis not present

## 2022-03-17 DIAGNOSIS — Z1152 Encounter for screening for COVID-19: Secondary | ICD-10-CM | POA: Diagnosis not present

## 2022-05-15 ENCOUNTER — Ambulatory Visit (INDEPENDENT_AMBULATORY_CARE_PROVIDER_SITE_OTHER): Payer: Medicaid Other | Admitting: Family Medicine

## 2022-05-15 ENCOUNTER — Other Ambulatory Visit (HOSPITAL_COMMUNITY)
Admission: RE | Admit: 2022-05-15 | Discharge: 2022-05-15 | Disposition: A | Discharge status: Home / Self Care | Payer: Medicaid Other | Source: Ambulatory Visit | Attending: Family Medicine | Admitting: Family Medicine

## 2022-05-15 VITALS — BP 141/95 | HR 93 | Ht 63.0 in | Wt 298.8 lb

## 2022-05-15 DIAGNOSIS — F339 Major depressive disorder, recurrent, unspecified: Secondary | ICD-10-CM | POA: Diagnosis not present

## 2022-05-15 DIAGNOSIS — Z23 Encounter for immunization: Secondary | ICD-10-CM | POA: Diagnosis not present

## 2022-05-15 DIAGNOSIS — Z113 Encounter for screening for infections with a predominantly sexual mode of transmission: Secondary | ICD-10-CM | POA: Insufficient documentation

## 2022-05-15 NOTE — Assessment & Plan Note (Signed)
-  PHQ-9 score of 21 with negative question 9 reviewed.  -reassurance provided, she has come a long way prior previously -therapy resources provided -follow up scheduled with me on 3/19 for mood check, consider initiating SSRI at this time if appropriate and patient agreeable

## 2022-05-15 NOTE — Progress Notes (Signed)
    SUBJECTIVE:   CHIEF COMPLAINT / HPI:   Patient presents desiring STI screening. Denies fever, chills, dysuria, vaginal discharge or irritation and abdominal or pelvic pain. Has nexplanon in place for birth control. She does  have a new partner and just wants to get tested to make sure she does not have an infection. She is also agreeable to doing blood testing as well.   Reports that her mood has been okay, she has episodes where she is down more. Denies any thoughts of harming herself, she says jokingly that she has way passed that stage. Her support system includes her mother and grandmother, she also shares that her daughter provides her with a lot of support and reassurance. She is very interested in therapy.   OBJECTIVE:   BP (!) 141/95   Pulse 93   Ht 5' 3"$  (1.6 m)   Wt 298 lb 12.8 oz (135.5 kg)   LMP 05/13/2022   SpO2 99%   BMI 52.93 kg/m   General: Patient well-appearing, in no acute distress. CV: RRR, no murmurs or gallops auscultated Resp: CTAB, no wheezing, rales or rhonchi noted Psych: mood appropriate, denies SI or plan GU: normal labia, no rashes or external lesions noted, bleeding noted (currently menstruating), no vaginal or cervical discharge noted, no associated odor noted  GU exam performed in the presence of chaperone, Lavell Anchors, CMA.   ASSESSMENT/PLAN:   Routine screening for STI (sexually transmitted infection) -cervical swab testing pending -RPR and HIV pending -safe sex counseling -up to date on PAP smear, next due March 2025  Depression, recurrent ([place]) -PHQ-9 score of 21 with negative question 9 reviewed.  -reassurance provided, she has come a long way prior previously -therapy resources provided -follow up scheduled with me on 3/19 for mood check, consider initiating SSRI at this time if appropriate and patient agreeable     Donney Dice, [place]

## 2022-05-15 NOTE — Patient Instructions (Addendum)
It was great seeing you today!  Today we completed testing for sexually transmitted infections, I will inform you of the results when they return. Please make sure to use protection as birth control does not protect against infections.   Below are resources for therapy, please pick one and establish care with them at your earliest convenience.   Please follow up at your next scheduled appointment on 3/19 at 9:30am, if anything arises between now and then, please don't hesitate to contact our office.   Thank you for allowing [place] to be a part of your medical care!  Thank you, Dr. Larae Grooms  Also a reminder of our clinic's no-show policy. Please make sure to arrive at least 15 minutes prior to your scheduled appointment time. Please try to cancel before 24 hours if you are not able to make it. If you no-show for 2 appointments then you will be receiving a warning letter. If you no-show after 3 visits, then you may be at risk of being dismissed from our clinic. This is to ensure that everyone is able to be seen in a timely manner. Thank you, we appreciate your assistance with this!    Therapy and Counseling Resources Most providers on this list will take Medicaid. Patients with commercial insurance or Medicare should contact their insurance company to get a list of in network providers.  [employer] (takes children) Location 1: [address], [place], [place] [address] Location 2: [place], [place] [address] [place] ([place] speaking therapist available)(habla espanol)(take medicare and medicaid)  [place], [place], [place] [address], [place] [email or web address] [phone]  [facility]:Psychiatry and Counseling [address] [place], [place] [address] [place], [place], [place] [address]      [phone]  [place] (spanish available) [place], [place] [address] [employer] (take [facility] and medicare) [address], [place],  [address]       [phone]     [place] (virtual only) [phone]  Jinny Blossom [facility] [address], [place], [place]  [facility]:  [place]. [place] [phone]  [facility]:  [place] [address] [phone]  [facility] (under & uninsured) [address], [place] [phone]    [email or web address]    [place] [address]  [place]    [phone]  [facility] [place]     Phone:  [phone]     [place]  [place] [address]      [place], [place] [address] [place]: [place], [place], [place]   [place] ([place] therapist) [email or web address]  [place] [address]   [place] [address]    [phone]    [facility]   [address],  [place], [place]   [place]  [place]  [facility]  [address] [place], [place]        [phone]  Open Access/Walk In Clinic under & uninsured  [facility]  [address] [place],  [place] [id number] Crisis [phone]  [facility] [place],  ([address])   [place], [place]: [phone]) 8:30 - 12; 1 - 2:30  [facility] the [place],  [place], [place]    ([phone]):8:30 - 12; 2 - 3PM  [facility] [employer],  [address],  [place]; [phone]):   Mon - Fri 8 AM - 5 PM  [facility] [place]  MWF  12:30 to 3:00 or call to schedule an appointment  [phone]  Specific Provider options Psychology Today  [email or web address] click on find a therapist  enter your zip code left side and select or tailor a therapist for your specific need.   [facility] Provider Directory [email or web address]  (Medicaid)   Follow all drop down to find a provider  [place] or [email or web address] [address], [place], [place] Recovery support and educational   24- Hour Availability:   [facility]  [address] [place], [place] Crisis [phone]  [facility] [employer] [phone]  [place]  [phone]   [place]  [phone] (after hours)  Therapeutic Alternative/Mobile Crisis   [phone]  [place] National Suicide Hotline  [phone] Diamantina Monks)  Call 911 or go to emergency room  [facility]  [phone]);  [place] and [employer]  [phone]); [place], [place], [place], [place], [place], [place], [place]

## 2022-05-15 NOTE — Assessment & Plan Note (Signed)
-  cervical swab testing pending -RPR and HIV pending -safe sex counseling -up to date on PAP smear, next due March 2025

## 2022-05-16 LAB — CERVICOVAGINAL ANCILLARY ONLY
Bacterial Vaginitis (gardnerella): POSITIVE — AB
Candida Glabrata: NEGATIVE
Candida Vaginitis: NEGATIVE
Chlamydia: POSITIVE — AB
Comment: NEGATIVE
Comment: NEGATIVE
Comment: NEGATIVE
Comment: NEGATIVE
Comment: NEGATIVE
Comment: NORMAL
Neisseria Gonorrhea: NEGATIVE
Trichomonas: NEGATIVE

## 2022-05-17 ENCOUNTER — Telehealth: Payer: Self-pay | Admitting: *Deleted

## 2022-05-17 ENCOUNTER — Other Ambulatory Visit: Payer: Self-pay | Admitting: Family Medicine

## 2022-05-17 DIAGNOSIS — A749 Chlamydial infection, unspecified: Secondary | ICD-10-CM

## 2022-05-17 DIAGNOSIS — N760 Acute vaginitis: Secondary | ICD-10-CM

## 2022-05-17 LAB — HIV ANTIBODY (ROUTINE TESTING W REFLEX): HIV Screen 4th Generation wRfx: NONREACTIVE

## 2022-05-17 LAB — T PALLIDUM ANTIBODY, EIA: T pallidum Antibody, EIA: NEGATIVE

## 2022-05-17 LAB — RPR W/REFLEX TO TREPSURE: RPR: NONREACTIVE

## 2022-05-17 MED ORDER — DOXYCYCLINE HYCLATE 100 MG PO TABS: 100.0000 mg | ORAL_TABLET | Freq: Two times a day (BID) | ORAL | 0 refills | Status: AC

## 2022-05-17 MED ORDER — METRONIDAZOLE 500 MG PO TABS: 500.0000 mg | ORAL_TABLET | Freq: Two times a day (BID) | ORAL | 0 refills | Status: AC

## 2022-05-17 NOTE — Telephone Encounter (Signed)
Pt calls because she has seen her positive chlamydia results in mychart.  She would like to be treated ASAP.  Message sent to Dr. Larae Grooms for orders. Christen Bame, CMA

## 2022-05-18 NOTE — Telephone Encounter (Signed)
-----   Message from [place] Pink, [place] sent at 05/17/2022 10:10 AM EST ----- Regarding: STI reporting Positive chlamydia

## 2022-05-18 NOTE — Telephone Encounter (Signed)
Faxed over STI sheet to [facility] health department.

## 2022-05-31 ENCOUNTER — Telehealth: Payer: Self-pay

## 2022-05-31 MED ORDER — FLUCONAZOLE 150 MG PO TABS: 150.0000 mg | ORAL_TABLET | Freq: Once | ORAL | 0 refills | Status: AC

## 2022-05-31 NOTE — Telephone Encounter (Signed)
I think appropriate to send in fluconazole.  Prescription sent to pharmacy.

## 2022-05-31 NOTE — Telephone Encounter (Signed)
Patient calls nurse line reporting vaginal yeast symptoms.   She reports she recently finished taking Doxycycline and Flagyl. She reports shortly after she developed "cottage cheese" like discharge and has been very itchy and irritated.   She denies any sexual contact with previous partner.   She has an apt with PCP on 3/19, however asks for #1 Diflucan to help relieve symptoms.   Will forward to PCP.

## 2022-06-05 ENCOUNTER — Encounter: Payer: Self-pay | Admitting: Family Medicine

## 2022-06-05 ENCOUNTER — Ambulatory Visit (INDEPENDENT_AMBULATORY_CARE_PROVIDER_SITE_OTHER): Payer: Medicaid Other | Admitting: Family Medicine

## 2022-06-05 VITALS — BP 138/78 | HR 106 | Wt 306.0 lb

## 2022-06-05 DIAGNOSIS — F339 Major depressive disorder, recurrent, unspecified: Secondary | ICD-10-CM | POA: Diagnosis not present

## 2022-06-05 MED ORDER — SERTRALINE HCL 50 MG PO TABS: 50.0000 mg | ORAL_TABLET | Freq: Every day | ORAL | 3 refills | Status: DC

## 2022-06-05 NOTE — Progress Notes (Signed)
    SUBJECTIVE:   CHIEF COMPLAINT / HPI:   Patient presents for follow up regarding mood concerns. Today she feels about the same compared to last time. Support system includes her mother, grandmother and daughter. Denies any thoughts of harming herself or anyone else. Was given therapy resources at the last visit and tried to call some of the places but unable to establish with anyone yet. She tries to think positively but will sometimes think negatively about herself. She tries to go out but admits that being around others in different social situations is not ideal. Was on zoloft before but only took it for a month since she did not notice any changes, she is open to getting back on this after further discussion.   OBJECTIVE:   BP 138/78   Pulse (!) 106   Wt (!) 306 lb (138.8 kg)   LMP 05/13/2022   SpO2 98%   BMI 54.21 kg/m    General: Patient well-appearing, in no acute distress. Resp: normal work of breathing noted Psych: mood appropriate, denies SI or HI without plan   ASSESSMENT/PLAN:   Depression, recurrent (HCC) -PHQ-9 score of 21 with negative question 9 reviewed and discussed.  -will restart zoloft 50 mg daily, explained to patient that it will likely take time to notice improvement -psychiatry referral placed for more therapy resources which patient is agreeable to -reassurance provided -follow up in 4-6 weeks for mood check, consider increasing dose of zoloft is appropriate      Harold Mattes Larae Grooms, [place]

## 2022-06-05 NOTE — Patient Instructions (Addendum)
It was great seeing you today!  Today we discussed your mood, I have restarted you on zoloft. Please take this daily. This medication can take time to improve your mood. I have also placed a referral to psychiatry, they should be in touch within the next few weeks to schedule a visit.   Please follow up at your next scheduled appointment in 4-6 weeks, if anything arises between now and then, please don't hesitate to contact our office.   Thank you for allowing [place] to be a part of your medical care!  Thank you, Dr. Larae Grooms  Also a reminder of our clinic's no-show policy. Please make sure to arrive at least 15 minutes prior to your scheduled appointment time. Please try to cancel before 24 hours if you are not able to make it. If you no-show for 2 appointments then you will be receiving a warning letter. If you no-show after 3 visits, then you may be at risk of being dismissed from our clinic. This is to ensure that everyone is able to be seen in a timely manner. Thank you, we appreciate your assistance with this!

## 2022-06-05 NOTE — Assessment & Plan Note (Signed)
-  PHQ-9 score of 21 with negative question 9 reviewed and discussed.  -will restart zoloft 50 mg daily, explained to patient that it will likely take time to notice improvement -psychiatry referral placed for more therapy resources which patient is agreeable to -reassurance provided -follow up in 4-6 weeks for mood check, consider increasing dose of zoloft is appropriate

## 2022-06-13 ENCOUNTER — Ambulatory Visit: Payer: Medicaid Other | Admitting: Family Medicine

## 2022-07-04 ENCOUNTER — Ambulatory Visit: Payer: Self-pay | Admitting: Family Medicine

## 2022-07-12 ENCOUNTER — Ambulatory Visit: Payer: Medicaid Other | Admitting: Family Medicine

## 2022-09-07 ENCOUNTER — Encounter: Payer: Self-pay | Admitting: Family Medicine

## 2022-09-07 ENCOUNTER — Ambulatory Visit (INDEPENDENT_AMBULATORY_CARE_PROVIDER_SITE_OTHER): Payer: Medicaid Other | Admitting: Family Medicine

## 2022-09-07 ENCOUNTER — Other Ambulatory Visit (HOSPITAL_COMMUNITY)
Admission: RE | Admit: 2022-09-07 | Discharge: 2022-09-07 | Disposition: A | Discharge status: Home / Self Care | Payer: Medicaid Other | Source: Ambulatory Visit | Attending: Family Medicine | Admitting: Family Medicine

## 2022-09-07 VITALS — BP 135/96 | HR 98 | Ht 63.0 in | Wt 307.4 lb

## 2022-09-07 DIAGNOSIS — Z113 Encounter for screening for infections with a predominantly sexual mode of transmission: Secondary | ICD-10-CM | POA: Diagnosis not present

## 2022-09-07 DIAGNOSIS — F339 Major depressive disorder, recurrent, unspecified: Secondary | ICD-10-CM | POA: Diagnosis not present

## 2022-09-07 MED ORDER — SERTRALINE HCL 100 MG PO TABS: 100.0000 mg | ORAL_TABLET | Freq: Every day | ORAL | 3 refills | Status: AC

## 2022-09-07 NOTE — Progress Notes (Signed)
    SUBJECTIVE:   CHIEF COMPLAINT / HPI:   Patient presents for mood follow up, started on zoloft 50 mg and feels like this is not helping. Usually stays inside and does not go out much. She feels like staying at home most of the time. Denies any thoughts of harming self or others. Feels like her mood is about the same since last visit, no necessarily getting worse or better. Compliant on zoloft daily. Support system is her mother, daughter and friend. She had an evaluation done with a therapy office who agrees with continued therapy, she has not heard from psychiatry yet as a referral was placed during the last visit.   Also wants to get STI screening today. Denies fever, chills, vaginal discharge, dysuria, vaginal irritation, abdominal and pelvic pain. Has a new partner but no symptoms, reports that she wants to get checked just to be on the safe side. Has nexplanon in place for contraception. Not using barrier protection.   OBJECTIVE:   BP (!) 135/96   Pulse 98   Ht 5\' 3"  (1.6 m)   Wt (!) 307 lb 6.4 oz (139.4 kg)   SpO2 100%   BMI 54.45 kg/m   General: Patient well-appearing, in no acute distress. Resp: normal work of breathing noted GU: normal labia, normal vagina and cervix, no vaginal discharge noted, no rashes or external lesions noted, no polyps or masses noted  Psych: mood appropriate, pleasant, denies SI or HI  GU exam performed in the presence of chaperone, Cleatrice Burke, CMA.   ASSESSMENT/PLAN:   Depression, recurrent (HCC) -PHQ-9 score of 15 with negative question 9 reviewed.  -seems to have minimal benefit from the medication, she seems to notice a very slight improvement thus will increase zoloft to 100 mg daily -it seems the the prior psych referral placed was closed due to no answer, I have placed another psych referral and confirmed patient's call back number. She is aware to be expecting this call. I believe she would benefit from further therapy resources from  psychiatry involvement which she agrees as well -reassurance provided -follow up with new PCP scheduled on 7/11 for mood check  Routine screening for STI (sexually transmitted infection) -cervicoancillary testing pending -patient also agreeable to blood work today, HIV and RPR pending -safe sex counseling provided  -follow up as appropriate     Reece Leader, DO [facility]

## 2022-09-07 NOTE — Assessment & Plan Note (Signed)
-  PHQ-9 score of 15 with negative question 9 reviewed.  -seems to have minimal benefit from the medication, she seems to notice a very slight improvement thus will increase zoloft to 100 mg daily -it seems the the prior psych referral placed was closed due to no answer, I have placed another psych referral and confirmed patient's call back number. She is aware to be expecting this call. I believe she would benefit from further therapy resources from psychiatry involvement which she agrees as well -reassurance provided -follow up with new PCP scheduled on 7/11 for mood check

## 2022-09-07 NOTE — Assessment & Plan Note (Signed)
-  cervicoancillary testing pending -patient also agreeable to blood work today, HIV and RPR pending -safe sex counseling provided  -follow up as appropriate

## 2022-09-07 NOTE — Patient Instructions (Addendum)
It was great seeing you today!  Today we discussed your mood, it seems that you have not noticed significant benefit in the medication so we will increase your zoloft to 100 mg daily. I have placed another psychiatry referral, please be on the look out for their call. They will be helpful to provide additional resources for therapy.  Today we also did screening for sexually transmitted infections, including blood work. I will let you know of any abnormal results.   Please make sure to use protection each time as birth control helps prevent pregnancy but does not prevent the spread of infections.   It was a pleasure taking care of you, your new primary care doctor will be Dr. Claudean Severance.   Please follow up at your next scheduled appointment on 7/11 at 3:35 pm with Dr. Claudean Severance, if anything arises between now and then, please don't hesitate to contact our office.   Thank you for allowing [place] to be a part of your medical care!  Thank you, Dr. Robyne Peers

## 2022-09-08 LAB — RPR: RPR Ser Ql: NONREACTIVE

## 2022-09-08 LAB — HIV ANTIBODY (ROUTINE TESTING W REFLEX): HIV Screen 4th Generation wRfx: NONREACTIVE

## 2022-09-11 ENCOUNTER — Other Ambulatory Visit: Payer: Self-pay | Admitting: Family Medicine

## 2022-09-11 DIAGNOSIS — B9689 Other specified bacterial agents as the cause of diseases classified elsewhere: Secondary | ICD-10-CM

## 2022-09-11 DIAGNOSIS — F339 Major depressive disorder, recurrent, unspecified: Secondary | ICD-10-CM

## 2022-09-11 LAB — CERVICOVAGINAL ANCILLARY ONLY
Bacterial Vaginitis (gardnerella): POSITIVE — AB
Candida Glabrata: NEGATIVE
Candida Vaginitis: NEGATIVE
Chlamydia: NEGATIVE
Comment: NEGATIVE
Comment: NEGATIVE
Comment: NEGATIVE
Comment: NEGATIVE
Comment: NEGATIVE
Comment: NORMAL
Neisseria Gonorrhea: NEGATIVE
Trichomonas: NEGATIVE

## 2022-09-11 MED ORDER — METRONIDAZOLE 500 MG PO TABS
500.0000 mg | ORAL_TABLET | Freq: Two times a day (BID) | ORAL | 0 refills | Status: AC
Start: 2022-09-11 — End: 2022-09-18

## 2022-09-27 ENCOUNTER — Ambulatory Visit: Payer: Self-pay | Admitting: Student

## 2022-11-23 ENCOUNTER — Encounter: Payer: Self-pay | Admitting: Family Medicine

## 2022-11-23 ENCOUNTER — Telehealth: Payer: Self-pay | Admitting: Family Medicine

## 2022-11-23 ENCOUNTER — Ambulatory Visit (INDEPENDENT_AMBULATORY_CARE_PROVIDER_SITE_OTHER): Payer: 59 | Admitting: Family Medicine

## 2022-11-23 ENCOUNTER — Other Ambulatory Visit (HOSPITAL_COMMUNITY)
Admission: RE | Admit: 2022-11-23 | Discharge: 2022-11-23 | Disposition: A | Discharge status: Home / Self Care | Payer: 59 | Source: Ambulatory Visit | Attending: Family Medicine | Admitting: Family Medicine

## 2022-11-23 ENCOUNTER — Ambulatory Visit: Payer: 59 | Admitting: Student

## 2022-11-23 VITALS — BP 158/91 | HR 103 | Ht 63.0 in | Wt 301.2 lb

## 2022-11-23 DIAGNOSIS — Z114 Encounter for screening for human immunodeficiency virus [HIV]: Secondary | ICD-10-CM

## 2022-11-23 DIAGNOSIS — Z113 Encounter for screening for infections with a predominantly sexual mode of transmission: Secondary | ICD-10-CM

## 2022-11-23 DIAGNOSIS — I10 Essential (primary) hypertension: Secondary | ICD-10-CM

## 2022-11-23 DIAGNOSIS — R030 Elevated blood-pressure reading, without diagnosis of hypertension: Secondary | ICD-10-CM

## 2022-11-23 LAB — POCT WET PREP (WET MOUNT)
Clue Cells Wet Prep Whiff POC: POSITIVE
Trichomonas Wet Prep HPF POC: ABSENT

## 2022-11-23 MED ORDER — AMLODIPINE BESYLATE 5 MG PO TABS: 5.0000 mg | ORAL_TABLET | Freq: Every day | ORAL | 1 refills | Status: DC

## 2022-11-23 MED ORDER — METRONIDAZOLE 0.75 % EX GEL: 1.0000 | Freq: Every day | CUTANEOUS | 0 refills | Status: DC

## 2022-11-23 NOTE — Progress Notes (Signed)
    SUBJECTIVE:   CHIEF COMPLAINT / HPI:   HTN: The patient was on Amlodipine 10 mg 5 years ago when pregnant with her daughter. She stated she was treated for pre-eclampsia. Her medication was discontinued after delivery, but she has not consistently monitored her BP since then--no other concerns.   STD screening:  She denies any GU symptoms. No known recent exposure. New partner x 1 month. Sexually active with infrequent use of condoms.  LMP: 11/20/22, irregular. She is currently on Nexplanon x 5 yrs   PERTINENT  PMH / PSH: PMHx reviewed  OBJECTIVE:   BP (!) 158/91   Pulse (!) 103   Ht 5\' 3"  (1.6 m)   Wt (!) 301 lb 3.2 oz (136.6 kg)   SpO2 100%   BMI 53.36 kg/m   Physical Exam Vitals and nursing note reviewed. Exam conducted with a chaperone present Cleatrice Burke).  Cardiovascular:     Rate and Rhythm: Normal rate and regular rhythm.     Heart sounds: Normal heart sounds. No murmur heard. Pulmonary:     Effort: Pulmonary effort is normal. No respiratory distress.     Breath sounds: Normal breath sounds. No wheezing.  Genitourinary:    Exam position: Lithotomy position.     Pubic Area: No rash.      Labia:        Right: No tenderness or lesion.        Left: No tenderness or lesion.      Cervix: Normal. No friability, lesion or cervical bleeding.     Comments: Scanty cheezy discharge in the vaginal vault     ASSESSMENT/PLAN:   Hypertension She was previously on Amlodipine 10 mg every day. Last dose was about 5 yrs ago Plan to start her back on Amlodipine at a lower dose of 5 mg every day Monitor BP closely at home Return in 4 weeks for PCP reeval Bmet and FLP checked - hx of HLD Lifestyle modification information provided via AVS She agreed with the plan   STD screening GC/Chlamydia, Trich, HIV and RPR checked Wet prep checked I will contact her soon with her results  Addendum: I attempted to call her with her wet prep result with no response I'll  escribe Metrogel and follow up with her with other test results.  Janit Pagan, MD [facility]

## 2022-11-23 NOTE — Telephone Encounter (Signed)
Unable to reach by phone or leave a message   Wet prep + for BV.  Will e-scribe topical Metrogel

## 2022-11-23 NOTE — Assessment & Plan Note (Addendum)
She was previously on Amlodipine 10 mg every day. Last dose was about 5 yrs ago Plan to start her back on Amlodipine at a lower dose of 5 mg every day Monitor BP closely at home Return in 4 weeks for PCP reeval Bmet and FLP checked - hx of HLD Lifestyle modification information provided via AVS She agreed with the plan

## 2022-11-24 LAB — LIPID PANEL
Chol/HDL Ratio: 3.9 ratio (ref 0.0–4.4)
Cholesterol, Total: 193 mg/dL (ref 100–199)
HDL: 50 mg/dL (ref >39)
LDL Chol Calc (NIH): 121 mg/dL — ABNORMAL HIGH (ref 0–99)
Triglycerides: 125 mg/dL (ref 0–149)
VLDL Cholesterol Cal: 22 mg/dL (ref 5–40)

## 2022-11-24 LAB — BASIC METABOLIC PANEL
BUN/Creatinine Ratio: 10 (ref 9–23)
BUN: 11 mg/dL (ref 6–20)
CO2: 20 mmol/L (ref 20–29)
Calcium: 9.2 mg/dL (ref 8.7–10.2)
Chloride: 105 mmol/L (ref 96–106)
Creatinine, Ser: 1.05 mg/dL — ABNORMAL HIGH (ref 0.57–1.00)
Glucose: 118 mg/dL — ABNORMAL HIGH (ref 70–99)
Potassium: 4.1 mmol/L (ref 3.5–5.2)
Sodium: 140 mmol/L (ref 134–144)
eGFR: 75 mL/min/{1.73_m2} (ref >59)

## 2022-11-24 LAB — HIV ANTIBODY (ROUTINE TESTING W REFLEX): HIV Screen 4th Generation wRfx: NONREACTIVE

## 2022-11-24 LAB — RPR: RPR Ser Ql: NONREACTIVE

## 2022-11-26 ENCOUNTER — Encounter: Payer: Self-pay | Admitting: Family Medicine

## 2022-11-26 ENCOUNTER — Telehealth: Payer: Self-pay | Admitting: Family Medicine

## 2022-11-26 LAB — CERVICOVAGINAL ANCILLARY ONLY
Chlamydia: NEGATIVE
Comment: NEGATIVE
Comment: NEGATIVE
Comment: NORMAL
Neisseria Gonorrhea: NEGATIVE
Trichomonas: POSITIVE — AB

## 2022-11-26 MED ORDER — METRONIDAZOLE 500 MG PO TABS: 500.0000 mg | ORAL_TABLET | Freq: Two times a day (BID) | ORAL | 0 refills | Status: AC

## 2022-11-26 NOTE — Telephone Encounter (Signed)
Multiple phone call attempts. Unable to reach the patient or leave a message.  Note:  + Trichomonas on her sent out cervical ancillary lab. She is currently on Metrogel for BV.   Plan is for her to d/c Metrogel and start oral Metronidazole for Trichomonas. She need for her partner to be tested and treated and return for a test of cure in 3 months.

## 2022-11-27 ENCOUNTER — Other Ambulatory Visit: Payer: Self-pay

## 2022-12-12 ENCOUNTER — Other Ambulatory Visit: Payer: Self-pay

## 2022-12-12 NOTE — Progress Notes (Addendum)
Jamie Boone 08-26-1995 [id number]  Patient outreached by Thomasene Ripple , PharmD Candidate on 12/11/23.  Blood Pressure Readings: Last documented ambulatory systolic blood pressure: 158 Last documented ambulatory diastolic blood pressure: 91 Does the patient have a validated home blood pressure machine?: No (will send in script)  Medication review was performed. Is the patient taking their medications as prescribed?: Yes   The following barriers to adherence were noted: Does the patient have cost concerns?: No Does the patient have transportation concerns?: No Does the patient need assistance obtaining refills?: Yes Does the patient occassionally forget to take some of their prescribed medications?: No Does the patient feel like one/some of their medications make them feel poorly?: No Does the patient have questions or concerns about their medications?: No Does the patient have a follow up scheduled with their primary care provider/cardiologist?: Yes   Interventions: Interventions Completed: Medications were reviewed, Patient was educated on proper technique to check home blood pressure and reminded to bring home machine and readings to next provider appointment, Patient was counseled on lifestyle modifications to improve blood pressure, including DASH diet and 150 minutes of exercise/week.  The patient has follow up scheduled: 12/18/22 PCP: Tiffany Kocher, DO   Thomasene Ripple, Student-PharmD

## 2022-12-18 ENCOUNTER — Ambulatory Visit: Payer: Self-pay | Admitting: Student

## 2022-12-20 ENCOUNTER — Other Ambulatory Visit: Payer: Self-pay | Admitting: Student

## 2022-12-20 MED ORDER — AMLODIPINE BESYLATE 5 MG PO TABS: 5.0000 mg | ORAL_TABLET | Freq: Every day | ORAL | 1 refills | Status: DC

## 2022-12-20 NOTE — Progress Notes (Signed)
90 day supply of amlodipine sent.

## 2023-01-11 ENCOUNTER — Telehealth: Payer: Self-pay

## 2023-01-11 NOTE — Telephone Encounter (Signed)
[facility] calls nurse line in regards to home blood pressure monitor.   He reports the paperwork has been faxed multiple times with no response back.   I advised our fax machine has been inconsistent the last several weeks.   He reports he will fax the paperwork again for signature for order.   Will forward to PCP and [place].

## 2023-02-07 NOTE — Telephone Encounter (Signed)
[facility] has called nurse line multiple times in regards to paperwork.   Advised this has been done and faxed back.   They report they did receive the paperwork, however they faxed back to [facility] for corrections on 11/1.  They report they will fax another form today outlining what needs to be corrected.   Advised will forward to PCP to make aware.

## 2023-02-26 NOTE — Telephone Encounter (Signed)
Received returned call from [facility] regarding order.   They need date of onset to be initialed and dated by provider.   [place] initialed and dated form. Form faxed back to [facility].   Veronda Prude, RN

## 2023-03-12 DIAGNOSIS — I10 Essential (primary) hypertension: Secondary | ICD-10-CM | POA: Diagnosis not present

## 2023-05-21 ENCOUNTER — Ambulatory Visit (INDEPENDENT_AMBULATORY_CARE_PROVIDER_SITE_OTHER): Payer: 59 | Admitting: Student

## 2023-05-21 ENCOUNTER — Encounter: Payer: Self-pay | Admitting: Student

## 2023-05-21 ENCOUNTER — Other Ambulatory Visit (HOSPITAL_COMMUNITY)
Admission: RE | Admit: 2023-05-21 | Discharge: 2023-05-21 | Disposition: A | Discharge status: Home / Self Care | Source: Ambulatory Visit | Attending: Family Medicine | Admitting: Family Medicine

## 2023-05-21 VITALS — BP 141/108 | HR 95 | Ht 63.0 in | Wt 312.2 lb

## 2023-05-21 DIAGNOSIS — Z113 Encounter for screening for infections with a predominantly sexual mode of transmission: Secondary | ICD-10-CM

## 2023-05-21 DIAGNOSIS — R87610 Atypical squamous cells of undetermined significance on cytologic smear of cervix (ASC-US): Secondary | ICD-10-CM | POA: Diagnosis not present

## 2023-05-21 LAB — POCT WET PREP (WET MOUNT)
Clue Cells Wet Prep Whiff POC: NEGATIVE
Trichomonas Wet Prep HPF POC: ABSENT
WBC, Wet Prep HPF POC: NONE SEEN

## 2023-05-21 NOTE — Progress Notes (Signed)
    SUBJECTIVE:   CHIEF COMPLAINT / HPI: Obtain pap smear  Pap Smear (ASCUS) and STI testing: Patient is a 28 y.o. female presenting for pap smear.  She states she does not have any discharge, odor or vaginal symptosm.  She is interested in screening for sexually transmitted infections today. She is on contraception with Nexplanon: Inserted by Dr. Jena Gauss on 09/13/2021, in the left arm.  Lot number [id number], removal date 09/13/2024.  OBJECTIVE:   BP (!) 141/108   Pulse 95   Ht 5\' 3"  (1.6 m)   Wt (!) 312 lb 4 oz (141.6 kg)   SpO2 100%   BMI 55.31 kg/m    General: NAD, pleasant, able to participate in exam Respiratory: Normal effort, no obvious respiratory distress Pelvic: VULVA: normal appearing vulva with no masses, tenderness or lesions, VAGINA: Normal appearing vagina with normal color, no lesions, with scant, white, and thin discharge present CERVIX: No lesions, scant, white, and thin discharge present. Pap smear performed with cytobrush  Chaperone Tashira CMA present for pelvic exam  ASSESSMENT/PLAN:   Assessment & Plan ASCUS of cervix with negative high risk HPV Presenting for repeat Pap smear.  Last Pap showed ASCUS, recommend repeat cytology with HPV testing. - Cotesting today Screen for sexually transmitted diseases Asymptomatic, Nexplanon for birth control. - Counseled on barrier use - GC chlamydia, wet prep, RPR, HIV  Follow-up recommendations: Patient scheduled for follow-up in 2 weeks for elevated blood pressure Patient scheduled for follow-up to discuss weight loss management  Tiffany Kocher, DO [facility]

## 2023-05-21 NOTE — Patient Instructions (Addendum)
 It was great to see you! Thank you for allowing me to participate in your care!   I recommend that you always bring your medications to each appointment as this makes it easy to ensure we are on the correct medications and helps [place] not miss when refills are needed.  Our plans for today:  - Schedule separate appointment to discuss weight loss - Please schedule follow-up in 2 weeks if BP still elevated on recheck - We are checking some labs today, I will call you if they are abnormal will send you a MyChart message or a letter if they are normal.  If you do not hear about your labs in the next 2 weeks please let us know.  Take care and seek immediate care sooner if you develop any concerns. Please remember to show up 15 minutes before your scheduled appointment time!  Tiffany Kocher, DO [facility]

## 2023-05-22 ENCOUNTER — Encounter: Payer: Self-pay | Admitting: Student

## 2023-05-22 LAB — RPR: RPR Ser Ql: NONREACTIVE

## 2023-05-22 LAB — HIV ANTIBODY (ROUTINE TESTING W REFLEX): HIV Screen 4th Generation wRfx: NONREACTIVE

## 2023-05-27 ENCOUNTER — Encounter: Payer: Self-pay | Admitting: Student

## 2023-05-27 ENCOUNTER — Ambulatory Visit (INDEPENDENT_AMBULATORY_CARE_PROVIDER_SITE_OTHER): Payer: Self-pay | Admitting: Student

## 2023-05-27 VITALS — BP 133/87 | HR 100 | Ht 63.0 in | Wt 314.0 lb

## 2023-05-27 DIAGNOSIS — Z720 Tobacco use: Secondary | ICD-10-CM | POA: Diagnosis not present

## 2023-05-27 DIAGNOSIS — R7303 Prediabetes: Secondary | ICD-10-CM | POA: Diagnosis not present

## 2023-05-27 DIAGNOSIS — E66813 Obesity, class 3: Secondary | ICD-10-CM | POA: Diagnosis not present

## 2023-05-27 DIAGNOSIS — Z6841 Body Mass Index (BMI) 40.0 and over, adult: Secondary | ICD-10-CM

## 2023-05-27 DIAGNOSIS — B9689 Other specified bacterial agents as the cause of diseases classified elsewhere: Secondary | ICD-10-CM

## 2023-05-27 DIAGNOSIS — N760 Acute vaginitis: Secondary | ICD-10-CM

## 2023-05-27 LAB — CYTOLOGY - PAP
Chlamydia: NEGATIVE
Comment: NEGATIVE
Comment: NEGATIVE
Comment: NORMAL
Diagnosis: NEGATIVE
High risk HPV: NEGATIVE
Neisseria Gonorrhea: NEGATIVE

## 2023-05-27 LAB — POCT GLYCOSYLATED HEMOGLOBIN (HGB A1C): Hemoglobin A1C: 5.9 % — AB (ref 4.0–5.6)

## 2023-05-27 MED ORDER — WEGOVY 0.25 MG/0.5ML ~~LOC~~ SOAJ
0.2500 mg | SUBCUTANEOUS | 0 refills | Status: DC
Start: 2023-05-27 — End: 2023-07-03

## 2023-05-27 MED ORDER — METRONIDAZOLE 500 MG PO TABS
500.0000 mg | ORAL_TABLET | Freq: Three times a day (TID) | ORAL | 0 refills | Status: DC
Start: 2023-05-27 — End: 2023-10-14

## 2023-05-27 NOTE — Patient Instructions (Addendum)
 It was great to see you! Thank you for allowing me to participate in your care!   I recommend that you always bring your medications to each appointment as this makes it easy to ensure we are on the correct medications and helps [place] not miss when refills are needed.  Our plans for today:  -We are checking some labs, I will notify you on MyChart of the results as we discussed. -I have sent in a medication called Wegovy, it is an injection you take once per week.  I recommend using a day (for example Friday) and doing this every Friday at the same time. -Please note that may take time before we can pick up [facility], as this will likely require a prior authorization.  This is completed by our pharmacy team, and you will be notified. -I have sent a referral to our pharmacy team for tobacco cessation.  You can schedule appointment front.   Take care and seek immediate care sooner if you develop any concerns. Please remember to show up 15 minutes before your scheduled appointment time!  Tiffany Kocher, DO [facility]

## 2023-05-27 NOTE — Progress Notes (Signed)
    SUBJECTIVE:   CHIEF COMPLAINT / HPI:   Obesity Weight loss history: Patient has struggled with weight for majority of her life.  She has tried weight loss regimens in the past.  Previously had been going to the gym approximately 1 year, but then lost her gym partner.  She struggles with motivation and exercise tolerance.  Additionally, she has tried to diet however stress often leads to failed dieting.  We discussed lifestyle modifications including diet and exercise.  Additionally we discussed medication options.  Phentermine, not a good option given high blood pressure.  Given smoking history, naltrexone/bupropion is a reasonable choice.  However, also unlikely to be covered by insurance.  Discussed GLP-1 agonist.  Patient denies history of gallstones, thyroid cancer.  Discussed side effects extensively including not limited to: Nausea, vomiting, increased risk gallstones, increased risk pancreatitis.  Tobacco dependence  Has tried patches and gum. Currently smoking 1 pack per week. Interested in quitting. Would be interested in speaking with Dr. Raymondo Band.   BV Discussed normal Pap results, patient did have evidence of BV on pap smear.  Patient endorses some vaginal discomfort.  Will treat.   OBJECTIVE:   BP 133/87   Pulse 100   Ht 5\' 3"  (1.6 m)   Wt (!) 314 lb (142.4 kg)   SpO2 100%   BMI 55.62 kg/m    General: NAD, pleasant Cardio: RRR, no MRG. Cap Refill <2s. Respiratory: CTAB, normal wob on RA GI: Abdomen is soft, not tender, not distended. BS present Skin: Warm and dry   ASSESSMENT/PLAN:   Assessment & Plan Class 3 severe obesity due to excess calories with serious comorbidity and body mass index (BMI) of 50.0 to 59.9 in adult [facility]) Comorbidity includes hypertension.  A1C 5.9 today, prediabetes.  Attempt lifestyle modifications with GLP-1. Consider bupropion-naltrexone combo if unable to obtain GLP-1. - Wegovy 0.25 mg weekly, 4 weeks - Follow-up in 4 weeks - Exercise  plan: 30 minutes of brisk walking x 2 days/week, increase as tolerated Tobacco use Interested in cessation, open to medication.  Agreeable to pharmacy referral. - Pharmacy referral, tobacco cessation Bacterial vaginosis Endorses discomfort, BV on Pap smear. - Metronidazole 500 mg x 7 days Prediabetes Lifestyle mod + GLP-1 as above.  Tiffany Kocher, DO [facility]

## 2023-06-03 ENCOUNTER — Other Ambulatory Visit (HOSPITAL_COMMUNITY): Payer: Self-pay

## 2023-06-03 NOTE — Telephone Encounter (Signed)
 Attempted to submit PA. Demographics submitted, awaiting clinical questions.   Veronda Prude, RN

## 2023-06-04 ENCOUNTER — Ambulatory Visit (INDEPENDENT_AMBULATORY_CARE_PROVIDER_SITE_OTHER): Admitting: Pharmacist

## 2023-06-04 ENCOUNTER — Encounter: Payer: Self-pay | Admitting: Student

## 2023-06-04 ENCOUNTER — Ambulatory Visit (INDEPENDENT_AMBULATORY_CARE_PROVIDER_SITE_OTHER): Admitting: Student

## 2023-06-04 ENCOUNTER — Encounter: Payer: Self-pay | Admitting: Pharmacist

## 2023-06-04 VITALS — BP 121/62 | HR 87 | Wt 312.8 lb

## 2023-06-04 DIAGNOSIS — F17200 Nicotine dependence, unspecified, uncomplicated: Secondary | ICD-10-CM | POA: Diagnosis not present

## 2023-06-04 DIAGNOSIS — R251 Tremor, unspecified: Secondary | ICD-10-CM

## 2023-06-04 LAB — GLUCOSE, POCT (MANUAL RESULT ENTRY): POC Glucose: 75 mg/dL (ref 70–99)

## 2023-06-04 NOTE — Patient Instructions (Signed)
 Nice to see you today!  Medication Changes: Continue all other medication the same.   Tobacco Patient Instructions  Quitting smoking is one of the most important decisions you can make for your current and future health. Consider what you dislike about smoking and how quitting could personally benefit you. Try to cut down. Aim for reducing and/or maintaining the amount you smoke to 0.5 to 1 cigarette over the next 4 weeks.  My target quit date is: 07/04/23  Starting today, Be a Quitter!  Remind yourself why you want to quit.  Delay your first cigarette of the day for as long as possible.  Start cleaning out all pockets, drawers, and your car of cigarettes.  Getting Through the Cravings Once You Are Smoke Free: Each craving will last about 10 minutes, whether or not you smoke. Here's how to get through the cravings without cigarettes:  DELAY: Tell yourself that you'll wait for the next craving. Do it every time! DEEP BREATHS: One reason smoking feels good is because you breathe in deeply to inhale. Take four slow, deep breaths and feel the relaxation without the hamful effects of cigarettes. DRINK WATER: Drink a glass of cool water. It will give your hands and mouth something to do and will help flush the nicotine out of your system faster. DIVERT: Do something else -- brush your teeth, take a walk, call a friend who can offer you support. Just moving onto something other than thinking about cigarettes will move you through the craving.   Frequently Asked Questions  What can I do when I get the urge to smoke? To get through the urge to smoke, try the following:  Review your reasons for quitting and think of all the benefits to your health, your finances, and your family.  Remind yourself that there is no such thing as just one cigarette -- or even one puff.  Ride out the desire to smoke. Use the 4 Os -- Delay, Deep Breaths, Drink Water and Divert to get you through. The craving will go  away eventually. Do not fool yourself into thinking you can have just one cigarette.  Any tips on how to deal with stress? Stress is a natural part of life. The key is to deal with it without reaching for a cigarette. Taking deep breaths, counting backwards from 10 and asking yourself 1-how big a deal is this?"  Writing down your feelings, talking with a friend and doing things like positive self-talk and meditation are some other ways that people deal with daily stress.  What if I start smoking again? Slips happen. Most people try to quit smoking a few times before they are successful. Don't beat yourself up if this happens to you! Ask yourself if this was a slip or a relapse. A slip is a one-time mistake that is quickly corrected. A relapse is going back to your old smoking habits.   If you slip, don't give up. Think of it as a learning experience. Ask yourself what went wrong and renew your commitment to staying away from smoking for good.  If you relapse, try not to get discouraged. Ask yourself the question "What caused me to start smoking?" Figure out what helped you and what didn't when you tried to quit. Knowing why you relapsed is useful information for your next attempt to quit.

## 2023-06-04 NOTE — Progress Notes (Signed)
   S:   Chief Complaint  Patient presents with   Medication Management    Tobacco cessation   28 y.o. female who presents for evaluation/assistance with tobacco dependence.  PMH is significant for history of class 3 severe obesity, prediabetes, bacterial vaginosis, tobacco use.   Patient was referred and last seen by Primary Care Provider, Dr. Claudean Severance, on 05/27/2023.  Also seen briefly by Dr. Claudean Severance today.   At last visit, patient reported A1C 5.9, diagnosed prediabetic. Initiated on semaglutide [facility]) 0.25 mg weekly.  Brand smoked [place]. Number of cigarettes/day 1. (Reduced to 1 cigarette/day on 3/13) Estimated nicotine content per cigarette (mg) 1.  Estimated nicotine intake per day 1 mg.     Medications used in past cessation efforts include: NRT  Rates IMPORTANCE of quitting tobacco on 1-10 scale of 10. Rates CONFIDENCE of quitting tobacco on 1-10 scale of 5. (By 07/05/23)  Most common triggers to use tobacco include; stress-inducing events from family, family members who also smoke   Motivation to quit: health  Patient reports recent diet changes: reducing fried food intake, drinking more water  Patient reports exercise habits: walking, light exercise 30 minutes 5 to 6 days a week  O:  Review of Systems  Neurological:  Positive for tremors.  All other systems reviewed and are negative.   Physical Exam Vitals reviewed.  Constitutional:      Appearance: Normal appearance.  Pulmonary:     Effort: Pulmonary effort is normal.  Neurological:     Mental Status: She is alert.  Psychiatric:        Mood and Affect: Mood normal.        Behavior: Behavior normal.        Thought Content: Thought content normal.        Judgment: Judgment normal.     Patient is participating in a Managed Medicaid Plan:  Yes  A/P: Tobacco use disorder with mild nicotine dependence in a patient who is good candidate for success because of verbalized high level commitment to quit.   -Patient reports goal to reduce and/or maintain 0.5 to 1 cigarette/day in one month. -Provided information on 1 800-QUIT NOW support program.   Pre-diabetes Recently 05/27/23 with A1C 5.9. Patient reports unable to obtain [facility] due to prior authorization issues with insurance. -Start Wegovy 0.25 mg weekly - PA pending. -Extensively discussed pathophysiology of diabetes, recommended lifestyle interventions, dietary effects on blood sugar control.  -Counseled on portion control on carbohydrates and increased protein intake.  Written patient instructions provided. Patient verbalized understanding of treatment plan.  Total time in face to face counseling 43 minutes.    Follow-up:  Pharmacist Dr. Raymondo Band 07/04/23 PCP clinic visit PRN Patient seen with Threasa Heads, PharmD Candidate.

## 2023-06-04 NOTE — Progress Notes (Unsigned)
  SUBJECTIVE:   CHIEF COMPLAINT / HPI: Bilateral tremor  Patient was originally to be seen at 3:30 PM, however due to clerical issue patient was left waiting in the room.  This appointment was an abbreviated appointment, done in conjunction with her pharmacy appointment.  Apologized to patient for the error, and agreed to a shorter visit today with a close follow-up at her earliest convenience.  Patient expressed agreement.  Bilateral tremor No prior history of tremor.  This started 3/17 in the evening.  She is not a diabetic, not on hypoglycemic medications.  Recently started metronidazole.  No prior history of allergy to metronidazole.  No fever, chills, rash, cough, headaches, NVD, chest pain.  Additionally, patient has been trying to cut back on smoking in anticipation of her visit with pharmacy today (which was a smoking cessation visit).  She has had no change in her caffeine intake.  She is still sleeping.  OBJECTIVE:   See pharmacy note on 06/04/2023 for vital signs.  Neuro: CN II: PERRL CN III, IV,VI: EOMI CV V: Normal sensation in V1, V2, V3 CVII: Symmetric smile and brow raise CN VIII: Normal hearing CN IX,X: Symmetric palate raise  CN XI: 5/5 shoulder shrug CN XII: Symmetric tongue protrusion  UE and LE strength 5/5 Normal sensation in UE and LE bilaterally  No ataxia with finger to nose  ASSESSMENT/PLAN:   Assessment & Plan Tremor of both hands Likely 2/2 tobacco cessation and negative control.  Will obtain TSH, CBG, tox assure to rule out other causes.  Low concern for neurologic cause, with neurologic exam normal.  Recommend holding metronidazole for now, although very low concern that this is a medication reaction. - TSH, CBG, UDS  Tiffany Kocher, DO [facility]

## 2023-06-04 NOTE — Telephone Encounter (Signed)
 Clinical questions submitted via covermymeds.   Will await response.   Veronda Prude, RN

## 2023-06-05 ENCOUNTER — Other Ambulatory Visit

## 2023-06-05 DIAGNOSIS — R251 Tremor, unspecified: Secondary | ICD-10-CM

## 2023-06-06 ENCOUNTER — Encounter: Payer: Self-pay | Admitting: Student

## 2023-06-06 LAB — TSH RFX ON ABNORMAL TO FREE T4: TSH: 1.57 u[IU]/mL (ref 0.450–4.500)

## 2023-06-06 NOTE — Progress Notes (Signed)
 Reviewed and agree with Dr Macky Lower plan.

## 2023-06-07 ENCOUNTER — Other Ambulatory Visit (HOSPITAL_COMMUNITY): Payer: Self-pay

## 2023-06-07 ENCOUNTER — Encounter: Payer: Self-pay | Admitting: Student

## 2023-06-07 ENCOUNTER — Telehealth: Payer: Self-pay

## 2023-06-07 LAB — TOXASSURE SELECT 13 (MW), URINE

## 2023-06-07 NOTE — Telephone Encounter (Signed)
 Pharmacy Patient Advocate Encounter  Received notification from [facility] that Prior Authorization for [facility] has been APPROVED from 06/07/23 to 12/04/23

## 2023-06-07 NOTE — Telephone Encounter (Signed)
 Pharmacy Patient Advocate Encounter   Received notification from Patient Advice Request messages that prior authorization for WEGOVY 0.25 is required/requested.   Insurance verification completed.   The patient is insured through [facility] .   PA required; PA submitted to above mentioned insurance via CoverMyMeds Key/confirmation #/[id number].  Per insurance: A new prior authorization (PA) request cannot be started at this time because there is a request already open for this drug. Please contact the PA call center if you have questions on the status of this request.

## 2023-06-07 NOTE — Telephone Encounter (Signed)
 Per insurance rep: patient called and started PA on 06/05/23. Rep was able to go over clinical questions with me over the phone.  PA submitted. Ref # [id number]  Expect decision in 24 hours

## 2023-06-12 ENCOUNTER — Telehealth: Payer: Self-pay | Admitting: Pharmacist

## 2023-06-12 NOTE — Telephone Encounter (Signed)
 Reviewed and agree with Dr Macky Lower plan.

## 2023-06-12 NOTE — Telephone Encounter (Signed)
 Pharmacy contacted to follow-up on availability / coverage for [facility].   Pharmacist reports that Reginal Lutes was approved and dispensed 06/10/2023.  Sharing to PCP and CPhT as an update to her access.    Total time with patient call and documentation of interaction: 4 minutes.  F/U Phone call planned: NONE

## 2023-06-19 ENCOUNTER — Ambulatory Visit: Payer: Self-pay | Admitting: Student

## 2023-06-19 ENCOUNTER — Other Ambulatory Visit: Payer: Self-pay

## 2023-06-19 ENCOUNTER — Encounter: Payer: Self-pay | Admitting: Student

## 2023-06-19 VITALS — BP 138/101 | HR 64 | Ht 63.0 in | Wt 313.1 lb

## 2023-06-19 DIAGNOSIS — E66813 Obesity, class 3: Secondary | ICD-10-CM | POA: Diagnosis not present

## 2023-06-19 DIAGNOSIS — I10 Essential (primary) hypertension: Secondary | ICD-10-CM

## 2023-06-19 DIAGNOSIS — Z6841 Body Mass Index (BMI) 40.0 and over, adult: Secondary | ICD-10-CM | POA: Diagnosis not present

## 2023-06-19 MED ORDER — AMLODIPINE BESYLATE 10 MG PO TABS: 10.0000 mg | ORAL_TABLET | Freq: Every day | ORAL | 2 refills | Status: DC

## 2023-06-19 MED ORDER — WEGOVY 0.5 MG/0.5ML ~~LOC~~ SOAJ: 0.5000 mg | SUBCUTANEOUS | 0 refills | Status: DC

## 2023-06-19 MED ORDER — WEGOVY 0.5 MG/0.5ML ~~LOC~~ SOAJ
0.5000 mg | SUBCUTANEOUS | 0 refills | Status: DC
Start: 2023-07-15 — End: 2023-06-19

## 2023-06-19 NOTE — Assessment & Plan Note (Signed)
 Poorly controlled.  Increasing amlodipine to 10 mg.  Anticipate pressures will improve with weight loss.  Will continue to monitor. - Amlodipine 10 mg daily - Follow-up in 2 weeks

## 2023-06-19 NOTE — Patient Instructions (Addendum)
 It was great to see you! Thank you for allowing me to participate in your care!   I recommend that you always bring your medications to each appointment as this makes it easy to ensure we are on the correct medications and helps [place] not miss when refills are needed.  Our plans for today:  - Take 10 mg of amlodipine daily - Take 0.25 mg Wegovy weekly. We will increase to 0.5 mg weekly on the 28th of April.   Take care and seek immediate care sooner if you develop any concerns. Please remember to show up 15 minutes before your scheduled appointment time!  Tiffany Kocher, DO [facility]

## 2023-06-19 NOTE — Progress Notes (Signed)
    SUBJECTIVE:   CHIEF COMPLAINT / HPI:   HTN Reports compliance with 5 mg amlodipine daily.  Blood pressures have been elevated at home, systolics as high as 150.  No chest pain, shortness of breath, headaches, blurry vision.  Obesity Started Wegovy on 06/06/2023. Patient has been increasing exercise, getting at least 30-40 minutes of exercise daily. Patient is attempting to make better choices.  Still having cravings, anticipate Reginal Lutes will continue to improve cravings. Patient has quit smoking, has not had a cigarette for 5 days.  She is not smoking cessation counseling with our clinic.  OBJECTIVE:   BP (!) 138/101   Pulse 64   SpO2 99%    General: NAD, pleasant Cardio: RRR, no MRG. Cap Refill <2s. Respiratory: CTAB, normal wob on RA GI: Abdomen is soft, not tender, not distended. BS present Skin: Warm and dry  ASSESSMENT/PLAN:   Assessment & Plan Class 3 severe obesity due to excess calories with serious comorbidity and body mass index (BMI) of 50.0 to 59.9 in adult [facility]) On second week of 0.25 mg Wegovy. Tolerating medication well.  Incorporating lifestyle modifications as above.  Weight 4/2: 313 lb - Titrate to 0.5 mg [facility] starting April 21 - Continue lifestyle modifications - Follow-up in 2 weeks Hypertension, unspecified type Poorly controlled.  Increasing amlodipine to 10 mg.  Anticipate pressures will improve with weight loss.  Will continue to monitor. - Amlodipine 10 mg daily - Follow-up in 2 weeks  Tiffany Kocher, DO [facility]

## 2023-06-26 ENCOUNTER — Encounter: Payer: Self-pay | Admitting: Student

## 2023-07-03 ENCOUNTER — Ambulatory Visit: Payer: Self-pay | Admitting: Student

## 2023-07-03 ENCOUNTER — Encounter: Payer: Self-pay | Admitting: Student

## 2023-07-03 VITALS — BP 157/105 | HR 88 | Ht 63.0 in | Wt 311.6 lb

## 2023-07-03 DIAGNOSIS — Z6841 Body Mass Index (BMI) 40.0 and over, adult: Secondary | ICD-10-CM | POA: Diagnosis not present

## 2023-07-03 DIAGNOSIS — E66813 Obesity, class 3: Secondary | ICD-10-CM

## 2023-07-03 MED ORDER — WEGOVY 1 MG/0.5ML ~~LOC~~ SOAJ: 1.0000 mg | SUBCUTANEOUS | 0 refills | Status: DC

## 2023-07-03 NOTE — Patient Instructions (Signed)
 It was great to see you! Thank you for allowing me to participate in your care!   I recommend that you always bring your medications to each appointment as this makes it easy to ensure we are on the correct medications and helps us  not miss when refills are needed.  Our plans for today:  - Continue to exercise daily - I recommend a protein shake in the morning if you are unable to breakfast - Continue to take 0.5 mg of Wegovy for the next 3 doses - I have sent a prescription for the 1 mg dose of Wegovy that you will start on May 12 - Please schedule appointment for follow-up in 4 weeks  Take care and seek immediate care sooner if you develop any concerns. Please remember to show up 15 minutes before your scheduled appointment time!  Lavada Porteous, DO [facility]

## 2023-07-03 NOTE — Progress Notes (Signed)
    SUBJECTIVE:   CHIEF COMPLAINT / HPI:   Obesity Patient taking Wegovy.  Titrated to 0.5 mg on 07/01/2023.  Denies side effects.  She has made significant improvement in lifestyle modifications including: Exercising 30 minutes/day.  She is also making healthy food choices, and trying to increase her protein intake.  OBJECTIVE:   BP (!) 157/105   Pulse 88   Ht 5\' 3"  (1.6 m)   Wt (!) 311 lb 9.6 oz (141.3 kg)   SpO2 100%   BMI 55.20 kg/m    General: NAD, pleasant Cardio: RRR, no MRG. Cap Refill <2s. Respiratory: CTAB, normal wob on RA GI: Abdomen is soft, not tender, not distended. BS present MSK: 5/5 strength upper and lower extremities Skin: Warm and dry  ASSESSMENT/PLAN:   Assessment & Plan Class 3 severe obesity due to excess calories with serious comorbidity and body mass index (BMI) of 50.0 to 59.9 in adult [facility]) Making great progress with lifestyle modifications.  Compliant with [id number].  Started 0.5 mg on 07/01/2023, and tolerating medication well.  Wegovy significantly improved her food cravings, including smoking habit. Weight 4/2: 313 lb Weight 4/16: 311 lb -Continue lifestyle modifications - Continue Wegovy 0.5 mg weekly - Sent in [place] 1 mg weekly to start 5/12   Follow-up recommendations Blood pressure is noted to be elevated, at the end of visit we discussed with patient.  She did not take her blood pressure medication this morning.  We will see her in about 4 weeks, and recheck then.  Discussed we may start a second agent if her blood pressure remains elevated.  Lavada Porteous, DO [facility]

## 2023-07-04 ENCOUNTER — Ambulatory Visit: Admitting: Pharmacist

## 2023-07-31 ENCOUNTER — Ambulatory Visit: Admitting: Student

## 2023-07-31 ENCOUNTER — Encounter: Payer: Self-pay | Admitting: Student

## 2023-07-31 VITALS — BP 144/92 | HR 97 | Ht 63.0 in | Wt 308.4 lb

## 2023-07-31 DIAGNOSIS — E66813 Obesity, class 3: Secondary | ICD-10-CM | POA: Diagnosis not present

## 2023-07-31 DIAGNOSIS — I10 Essential (primary) hypertension: Secondary | ICD-10-CM | POA: Diagnosis not present

## 2023-07-31 DIAGNOSIS — Z6841 Body Mass Index (BMI) 40.0 and over, adult: Secondary | ICD-10-CM

## 2023-07-31 DIAGNOSIS — R110 Nausea: Secondary | ICD-10-CM

## 2023-07-31 MED ORDER — AMLODIPINE BESYLATE-VALSARTAN 10-160 MG PO TABS: 1.0000 | ORAL_TABLET | Freq: Every day | ORAL | 1 refills | Status: AC

## 2023-07-31 MED ORDER — ONDANSETRON 4 MG PO TBDP: 4.0000 mg | ORAL_TABLET | Freq: Three times a day (TID) | ORAL | 0 refills | Status: DC | PRN

## 2023-07-31 NOTE — Progress Notes (Signed)
    SUBJECTIVE:   CHIEF COMPLAINT / HPI:   Obesity On Wegovy , started 1 mg Wegovy  this Monday.  Does have increased nausea with this increase this week. Exercising: Has been swimming, and being active outside daily.  At least 30 minutes of physical activity daily. Diet: Reports significant improvement in food cravings. Still working on protein intake. 2 meals a day, discussed trying 3.   Hypertension Currently takes amlodipine  10 mg daily.  Reports good compliance, no side effects.  Does not check blood pressure at home.  OBJECTIVE:   BP (!) 144/92   Pulse 97   Ht 5\' 3"  (1.6 m)   Wt (!) 308 lb 6.4 oz (139.9 kg)   SpO2 100%   BMI 54.63 kg/m    General: NAD, pleasant Cardio: RRR, no MRG. Cap Refill <2s. Respiratory: CTAB, normal wob on RA Skin: Warm and dry  ASSESSMENT/PLAN:   Assessment & Plan Class 3 severe obesity due to excess calories with serious comorbidity and body mass index (BMI) of 50.0 to 59.9 in adult Make great progress/modifications, including smoking cessation.  Compliant with Wegovy .  Started 1.0 mg on 07/29/2023 and tolerating medication well-does have nausea that lasts 1 to 2 days.  Food cravings have significantly improved. Weight 4/2: 313 lb Weight 4/16: 311 lb Weight 5/14: 308 lb - Continue lifestyle modifications - Zofran  4 mg 8 hours as needed for Ozempic related nausea - Continue Wegovy  1 mg weekly - Follow-up in 4 to 6 weeks Hypertension, unspecified type Repeat blood pressure remains elevated.  Discussed starting second agent, patient agreeable.  Requesting combination pill. - Start amlodipine -valsartan 10-160 mg tablet daily - Follow-up in 2 weeks   Lavada Porteous, DO [facility]

## 2023-07-31 NOTE — Assessment & Plan Note (Signed)
 Repeat blood pressure remains elevated.  Discussed starting second agent, patient agreeable.  Requesting combination pill. - Start amlodipine -valsartan 10-160 mg tablet daily - Follow-up in 2 weeks

## 2023-07-31 NOTE — Patient Instructions (Addendum)
 It was great to see you! Thank you for allowing me to participate in your care!   I recommend that you always bring your medications to each appointment as this makes it easy to ensure we are on the correct medications and helps us  not miss when refills are needed.  Our plans for today:  - Continue your 1 mg of Wegovy  weekly, follow-up in 4 to 6 weeks for weight loss management - I have sent a new blood pressure medication for you, please pick this up at your pharmacy and start taking tomorrow morning.  Please schedule follow-up in 2 weeks for blood pressure recheck. - I have sent Zofran , you can use medication every 8 hours as needed for nausea related to Ozempic   Take care and seek immediate care sooner if you develop any concerns. Please remember to show up 15 minutes before your scheduled appointment time!  Lavada Porteous, DO [facility]

## 2023-08-14 NOTE — Progress Notes (Deleted)
    SUBJECTIVE:   CHIEF COMPLAINT / HPI:   HTN f/u Started amlodipine -valsartan  10-160mg  daily on 07/31/23 for elevated office readings, was previously on amlodipine  10mg   PERTINENT  PMH / PSH: HTN, depression, tobacco use disorder  OBJECTIVE:   There were no vitals taken for this visit.  ***  ASSESSMENT/PLAN:   Assessment & Plan      Jamie Cumins, DO [facility]

## 2023-08-15 ENCOUNTER — Ambulatory Visit: Admitting: Family Medicine

## 2023-08-27 ENCOUNTER — Ambulatory Visit: Admitting: Student

## 2023-08-27 ENCOUNTER — Encounter: Payer: Self-pay | Admitting: Student

## 2023-08-27 VITALS — BP 136/86 | HR 100 | Ht 63.0 in | Wt 304.5 lb

## 2023-08-27 DIAGNOSIS — E66813 Obesity, class 3: Secondary | ICD-10-CM

## 2023-08-27 DIAGNOSIS — Z6841 Body Mass Index (BMI) 40.0 and over, adult: Secondary | ICD-10-CM

## 2023-08-27 DIAGNOSIS — R110 Nausea: Secondary | ICD-10-CM | POA: Diagnosis not present

## 2023-08-27 DIAGNOSIS — I10 Essential (primary) hypertension: Secondary | ICD-10-CM

## 2023-08-27 MED ORDER — ONDANSETRON 4 MG PO TBDP: 4.0000 mg | ORAL_TABLET | Freq: Three times a day (TID) | ORAL | 0 refills | Status: DC | PRN

## 2023-08-27 MED ORDER — WEGOVY 1.7 MG/0.75ML ~~LOC~~ SOAJ: 1.7000 mg | SUBCUTANEOUS | 1 refills | Status: DC

## 2023-08-27 NOTE — Assessment & Plan Note (Addendum)
 Well-controlled - Continue amlodipine -valsartan  10-160 mg daily

## 2023-08-27 NOTE — Patient Instructions (Addendum)
 It was great to see you! Thank you for allowing me to participate in your care!   I recommend that you always bring your medications to each appointment as this makes it easy to ensure we are on the correct medications and helps us  not miss when refills are needed.  Our plans for today:  - Take 1.7 mg of Wegovy  weekly - Zofran  as needed for nausea - Continue to work towards eating 2-3 meals per day - Continue with exercise, I recommend 45 minutes of moderate intensity exercise at least 4 days/week - Follow-up in 6 to 8 weeks for weight loss management - Schedule appointment for low back pain at your earliest convenience  Take care and seek immediate care sooner if you develop any concerns. Please remember to show up 15 minutes before your scheduled appointment time!  Lavada Porteous, DO [facility] Family Medicine

## 2023-08-27 NOTE — Progress Notes (Signed)
    SUBJECTIVE:   CHIEF COMPLAINT / HPI:   Weight loss management Started 1 mg of Wegovy  on Monday 5/12.  Reports compliance and minimal side-effects. Occasional nausea controlled with Zofran . Good craving control. Exercise: 30 minutes of moderate intensity exercise daily (brisk walks, swimming, playing sports with her children) Diet: 1-2 meals a day. Oatmeal with boiled egg is her go-to meal. She has cut out caloric drinks.  Hypertension Currently takes amlodipine -valsartan  10-160 mg tablet which restarted on 07/31/2023 for continued high blood pressure.  She reports compliance with medication and no side effects.   OBJECTIVE:   BP 136/86   Pulse 100   Ht 5\' 3"  (1.6 m)   Wt (!) 304 lb 8 oz (138.1 kg)   SpO2 100%   BMI 53.94 kg/m    General: NAD, pleasant Cardio: RRR, no murmurs Respiratory: CTAB, normal wob on RA Skin: Warm and dry  ASSESSMENT/PLAN:   Assessment & Plan Class 3 severe obesity due to excess calories with serious comorbidity and body mass index (BMI) of 50.0 to 59.9 in adult Consistent progress with Wegovy .  Appetite and craving control achieved.  Doing well with lifestyle modifications. Weight 4/2: 313 lb Weight 4/16: 311 lb Weight 6/10: 304 lb -Increase Wegovy  to 1.7 mg weekly -Continue lifestyle modifications -Zofran  as needed nausea - Follow-up in 6-8 weeks  Hypertension, unspecified type Well-controlled - Continue amlodipine -valsartan  10-160 mg daily     Lavada Porteous, DO [facility]

## 2023-08-28 ENCOUNTER — Ambulatory Visit: Payer: Self-pay | Admitting: Student

## 2023-08-28 LAB — BASIC METABOLIC PANEL WITH GFR
BUN/Creatinine Ratio: 7 — ABNORMAL LOW (ref 9–23)
BUN: 7 mg/dL (ref 6–20)
CO2: 20 mmol/L (ref 20–29)
Calcium: 9.1 mg/dL (ref 8.7–10.2)
Chloride: 107 mmol/L — ABNORMAL HIGH (ref 96–106)
Creatinine, Ser: 0.94 mg/dL (ref 0.57–1.00)
Glucose: 90 mg/dL (ref 70–99)
Potassium: 4.1 mmol/L (ref 3.5–5.2)
Sodium: 142 mmol/L (ref 134–144)
eGFR: 85 mL/min/1.73

## 2023-09-08 ENCOUNTER — Encounter (INDEPENDENT_AMBULATORY_CARE_PROVIDER_SITE_OTHER): Payer: Self-pay

## 2023-09-10 ENCOUNTER — Ambulatory Visit: Admitting: Student

## 2023-10-07 ENCOUNTER — Encounter: Payer: Self-pay | Admitting: Student

## 2023-10-07 ENCOUNTER — Ambulatory Visit (INDEPENDENT_AMBULATORY_CARE_PROVIDER_SITE_OTHER): Admitting: Student

## 2023-10-07 VITALS — BP 135/84 | HR 83 | Ht 63.0 in | Wt 296.1 lb

## 2023-10-07 DIAGNOSIS — R110 Nausea: Secondary | ICD-10-CM

## 2023-10-07 DIAGNOSIS — Z975 Presence of (intrauterine) contraceptive device: Secondary | ICD-10-CM | POA: Diagnosis not present

## 2023-10-07 DIAGNOSIS — Z6841 Body Mass Index (BMI) 40.0 and over, adult: Secondary | ICD-10-CM | POA: Diagnosis not present

## 2023-10-07 DIAGNOSIS — E66813 Obesity, class 3: Secondary | ICD-10-CM

## 2023-10-07 MED ORDER — WEGOVY 1.7 MG/0.75ML ~~LOC~~ SOAJ: 1.7000 mg | SUBCUTANEOUS | 1 refills | Status: AC

## 2023-10-07 MED ORDER — ONDANSETRON 4 MG PO TBDP
4.0000 mg | ORAL_TABLET | Freq: Three times a day (TID) | ORAL | 0 refills | Status: DC | PRN
Start: 2023-10-07 — End: 2023-11-05

## 2023-10-07 NOTE — Patient Instructions (Addendum)
 It was great to see you! Thank you for allowing me to participate in your care!   I recommend that you always bring your medications to each appointment as this makes it easy to ensure we are on the correct medications and helps us  not miss when refills are needed.  Our plans for today:  - Continue taking 1.75 mg Wegovy  weekly - I have refilled your Zofran  - Continue to exercise, try to eat 3 meals a day.  Please focus on getting protein, this can be in the form of beans, fish, chicken, beef, dairy.   Take care and seek immediate care sooner if you develop any concerns. Please remember to show up 15 minutes before your scheduled appointment time!  Gladis Church, DO [facility]

## 2023-10-07 NOTE — Assessment & Plan Note (Signed)
 Updated problem list.  Insertion date 09/13/2021, removal due 09/13/2024.

## 2023-10-07 NOTE — Progress Notes (Signed)
    SUBJECTIVE:   CHIEF COMPLAINT / HPI:   Weight loss management  Started 1.7 mg of Wegovy  5 weeks ago. Ran out of Medication ~1 week ago.  Nausea with certain foods, particularly with smell, struggling to eat full meals, ran out of Zofran - but with Zofran  does not have this issue.  Exercise: 30 min a day, Tue/Thur/Fri and Weekend walks. Diet: 2 meals a day. Water, no caloric drinks.  Patient like to increase her Wegovy , discussed my hesitancy to do so given she is struggling to eat full meals at this time.  Additionally had adequate weight loss with her current dose.  She is agreeable to continuing 1.7 mg, and following up in 4-6 weeks.  OBJECTIVE:   BP 135/84   Pulse 83   Ht 5' 3 (1.6 m)   Wt 296 lb 2 oz (134.3 kg)   SpO2 99%   BMI 52.46 kg/m    General: NAD, well-appearing, well-nourished Respiratory: No respiratory distress, breathing comfortably, able to speak in full sentences Skin: warm and dry, no rashes noted on exposed skin Psych: Appropriate affect and mood  ASSESSMENT/PLAN:   Assessment & Plan Class 3 severe obesity due to excess calories with serious comorbidity and body mass index (BMI) of 50.0 to 59.9 in adult Making great progress.  Continue lifestyle modifications. Weight 4/2: 313 lb Weight 4/16: 311 lb Weight 6/10: 304 lb Weight 7/21: 296 lb - Wegovy  1.7 mg weekly - Zofran  as needed - Lifestyle modifications - Follow-up in 4 to 6 weeks Nexplanon  in place Updated problem list.  Insertion date 09/13/2021, removal due 09/13/2024.   Gladis Church, DO [facility]

## 2023-10-14 ENCOUNTER — Ambulatory Visit (INDEPENDENT_AMBULATORY_CARE_PROVIDER_SITE_OTHER)

## 2023-10-14 ENCOUNTER — Ambulatory Visit: Payer: Self-pay

## 2023-10-14 VITALS — BP 134/80 | HR 100 | Wt 297.0 lb

## 2023-10-14 DIAGNOSIS — M545 Low back pain, unspecified: Secondary | ICD-10-CM | POA: Diagnosis not present

## 2023-10-14 MED ORDER — NAPROXEN 500 MG PO TABS: 500.0000 mg | ORAL_TABLET | Freq: Two times a day (BID) | ORAL | 0 refills | Status: DC

## 2023-10-14 MED ORDER — DICLOFENAC SODIUM 1 % EX GEL: 2.0000 g | Freq: Four times a day (QID) | CUTANEOUS | 1 refills | Status: AC | PRN

## 2023-10-14 NOTE — Progress Notes (Signed)
    SUBJECTIVE:   CHIEF COMPLAINT / HPI:   Jamie Boone is a 28yo F who presents to the clinic for follow up of lower lumbar pain x3 weeks. The patient cannot think of any inciting events. She woke up one day and could barely get off the bed she was in so much pain, she had to scoot down the bed instead. Once she was on her feet she was able to walk fine but walking does exacerbate the pain. Sitting and lying certain ways also exacerbate the pain. She has tried 500mg  of tylenol without relief. She has no history of back problems. She denies recent trauma, falls, lifting, saddle anesthesia, urinary or fecal incontinence, or muscle weakness.   PERTINENT  PMH / PSH: HTN  OBJECTIVE:   BP 134/80   Pulse 100   Wt 297 lb (134.7 kg)   SpO2 98%   BMI 52.61 kg/m    General: A&O, NAD, pleasant  HEENT: No sign of trauma, EOM grossly intact Cardiac: RRR, no m/r/g Respiratory: CTAB, normal WOB, no w/c/r GI: Soft, NTTP, non-distended  Extremities: NTTP, no peripheral edema Back: no overlying skin changes, moderate TTP of the L lumbar paraspinal muscles, significant TTP of L3 midline with mild TTP to L1-2 and L4-5, no TTP to R paraspinal muscles, no TTP of the piriformis bilaterally, no TTP of greater trochanter bilaterally, +SLR bilaterally, limited ROM secondary to pain Neuro: slightly antalgic gait, moves all four extremities appropriately, good strength of legs bilaterally, no sensation deficits Psych: Appropriate mood and affect except when testing for back pain patient becomes very tearful  ASSESSMENT/PLAN:   Assessment & Plan Lumbar pain Ddx: muscle spasm vs herniated disc. No inciting factors identified. Severe pain on exam is indication for imaging. Patient was given a lidocaine  patch from the clinic on discharge. -Lumbar xray ordered.  -Naproxen  500mg  BID for 7 days as needed for pain and muscle inflammation.  -Voltaren  gel sent to pharmacy as needed for pain.  -Return to clinic for lumbar  pain follow up in 1 week.    Camie Dixons, DO [facility]

## 2023-10-14 NOTE — Patient Instructions (Addendum)
 Today we talked about your back pain. I have sent in the order for a back xray. I have sent in 500mg  of naproxen  take this twice a day with food. Use the voltaren  gel as needed for pain.   Low Back Sprain or Strain Rehab Ask your health care provider which exercises are safe for you. Do exercises exactly as told by your health care provider and adjust them as directed. It is normal to feel mild stretching, pulling, tightness, or discomfort as you do these exercises. Stop right away if you feel sudden pain or your pain gets worse. Do not begin these exercises until told by your health care provider. Stretching and range-of-motion exercises These exercises warm up your muscles and joints and improve the movement and flexibility of your back. These exercises also help to relieve pain, numbness, and tingling. Lumbar rotation  Lie on your back on a firm bed or the floor with your knees bent. Straighten your arms out to your sides so each arm forms a 90-degree angle (right angle) with a side of your body. Slowly move (rotate) both of your knees to one side of your body until you feel a stretch in your lower back (lumbar). Try not to let your shoulders lift off the floor. Hold this position for __________ seconds. Tense your abdominal muscles and slowly move your knees back to the starting position. Repeat this exercise on the other side of your body. Repeat __________ times. Complete this exercise __________ times a day. Single knee to chest  Lie on your back on a firm bed or the floor with both legs straight. Bend one of your knees. Use your hands to move your knee up toward your chest until you feel a gentle stretch in your lower back and buttock. Hold your leg in this position by holding on to the front of your knee. Keep your other leg as straight as possible. Hold this position for __________ seconds. Slowly return to the starting position. Repeat with your other leg. Repeat __________ times.  Complete this exercise __________ times a day. Strengthening exercises These exercises build strength and endurance in your back. Endurance is the ability to use your muscles for a long time, even after they get tired. Pelvic tilt This exercise strengthens the muscles that lie deep in the abdomen. Lie on your back on a firm bed or the floor with your legs extended. Bend your knees so they are pointing toward the ceiling and your feet are flat on the floor. Tighten your lower abdominal muscles to press your lower back against the floor. This motion will tilt your pelvis so your tailbone points up toward the ceiling instead of pointing to your feet or the floor. To help with this exercise, you may place a small towel under your lower back and try to push your back into the towel. Hold this position for __________ seconds. Let your muscles relax completely before you repeat this exercise. Repeat __________ times. Complete this exercise __________ times a day. Alternating arm and leg raises  Get on your hands and knees on a firm surface. If you are on a hard floor, you may want to use padding, such as an exercise mat, to cushion your knees. Line up your arms and legs. Your hands should be directly below your shoulders, and your knees should be directly below your hips. Lift your left leg behind you. At the same time, raise your right arm and straighten it in front of you. Do  not lift your leg higher than your hip. Do not lift your arm higher than your shoulder. Keep your abdominal and back muscles tight. Keep your hips facing the ground. Do not arch your back. Keep your balance carefully, and do not hold your breath. Hold this position for __________ seconds. Slowly return to the starting position. Repeat with your right leg and your left arm. Repeat __________ times. Complete this exercise __________ times a day. Abdominal set with straight leg raise  Lie on your back on a firm bed or the  floor. Bend one of your knees and keep your other leg straight. Tense your abdominal muscles and lift your straight leg up, 4-6 inches (10-15 cm) off the ground. Keep your abdominal muscles tight and hold this position for __________ seconds. Do not hold your breath. Do not arch your back. Keep it flat against the ground. Keep your abdominal muscles tense as you slowly lower your leg back to the starting position. Repeat with your other leg. Repeat __________ times. Complete this exercise __________ times a day. Single leg lower with bent knees Lie on your back on a firm bed or the floor. Tense your abdominal muscles and lift your feet off the floor, one foot at a time, so your knees and hips are bent in 90-degree angles (right angles). Your knees should be over your hips and your lower legs should be parallel to the floor. Keeping your abdominal muscles tense and your knee bent, slowly lower one of your legs so your toe touches the ground. Lift your leg back up to return to the starting position. Do not hold your breath. Do not let your back arch. Keep your back flat against the ground. Repeat with your other leg. Repeat __________ times. Complete this exercise __________ times a day. Posture and body mechanics Good posture and healthy body mechanics can help to relieve stress in your body's tissues and joints. Body mechanics refers to the movements and positions of your body while you do your daily activities. Posture is part of body mechanics. Good posture means: Your spine is in its natural S-curve position (neutral). Your shoulders are pulled back slightly. Your head is not tipped forward (neutral). Follow these guidelines to improve your posture and body mechanics in your everyday activities. Standing  When standing, keep your spine neutral and your feet about hip-width apart. Keep a slight bend in your knees. Your ears, shoulders, and hips should line up. When you do a task in  which you stand in one place for a long time, place one foot up on a stable object that is 2-4 inches (5-10 cm) high, such as a footstool. This helps keep your spine neutral. Sitting  When sitting, keep your spine neutral and keep your feet flat on the floor. Use a footrest, if necessary, and keep your thighs parallel to the floor. Avoid rounding your shoulders, and avoid tilting your head forward. When working at a desk or a computer, keep your desk at a height where your hands are slightly lower than your elbows. Slide your chair under your desk so you are close enough to maintain good posture. When working at a computer, place your monitor at a height where you are looking straight ahead and you do not have to tilt your head forward or downward to look at the screen. Resting When lying down and resting, avoid positions that are most painful for you. If you have pain with activities such as sitting, bending, stooping, or  squatting, lie in a position in which your body does not bend very much. For example, avoid curling up on your side with your arms and knees near your chest (fetal position). If you have pain with activities such as standing for a long time or reaching with your arms, lie with your spine in a neutral position and bend your knees slightly. Try the following positions: Lying on your side with a pillow between your knees. Lying on your back with a pillow under your knees. Lifting  When lifting objects, keep your feet at least shoulder-width apart and tighten your abdominal muscles. Bend your knees and hips and keep your spine neutral. It is important to lift using the strength of your legs, not your back. Do not lock your knees straight out. Always ask for help to lift heavy or awkward objects. This information is not intended to replace advice given to you by your health care provider. Make sure you discuss any questions you have with your health care provider. Document Revised:  07/09/2022 Document Reviewed: 05/23/2020 Elsevier Patient Education  2024 [employer].

## 2023-11-05 ENCOUNTER — Emergency Department (HOSPITAL_COMMUNITY)
Admission: EM | Admit: 2023-11-05 | Discharge: 2023-11-05 | Disposition: A | Discharge status: Home / Self Care | Attending: Emergency Medicine | Admitting: Emergency Medicine

## 2023-11-05 ENCOUNTER — Emergency Department (HOSPITAL_COMMUNITY)

## 2023-11-05 ENCOUNTER — Other Ambulatory Visit: Payer: Self-pay

## 2023-11-05 ENCOUNTER — Encounter (HOSPITAL_COMMUNITY): Payer: Self-pay

## 2023-11-05 DIAGNOSIS — R109 Unspecified abdominal pain: Secondary | ICD-10-CM | POA: Diagnosis not present

## 2023-11-05 DIAGNOSIS — R1084 Generalized abdominal pain: Secondary | ICD-10-CM | POA: Diagnosis not present

## 2023-11-05 DIAGNOSIS — I10 Essential (primary) hypertension: Secondary | ICD-10-CM | POA: Diagnosis not present

## 2023-11-05 DIAGNOSIS — R112 Nausea with vomiting, unspecified: Secondary | ICD-10-CM | POA: Diagnosis not present

## 2023-11-05 DIAGNOSIS — N3001 Acute cystitis with hematuria: Secondary | ICD-10-CM | POA: Insufficient documentation

## 2023-11-05 DIAGNOSIS — R932 Abnormal findings on diagnostic imaging of liver and biliary tract: Secondary | ICD-10-CM | POA: Diagnosis not present

## 2023-11-05 DIAGNOSIS — Z79899 Other long term (current) drug therapy: Secondary | ICD-10-CM | POA: Insufficient documentation

## 2023-11-05 LAB — CBC WITH DIFFERENTIAL/PLATELET
Abs Immature Granulocytes: 0.04 K/uL (ref 0.00–0.07)
Basophils Absolute: 0 K/uL (ref 0.0–0.1)
Basophils Relative: 0 %
Eosinophils Absolute: 0.1 K/uL (ref 0.0–0.5)
Eosinophils Relative: 1 %
HCT: 44.6 % (ref 36.0–46.0)
Hemoglobin: 14.4 g/dL (ref 12.0–15.0)
Immature Granulocytes: 0 %
Lymphocytes Relative: 24 %
Lymphs Abs: 2.5 K/uL (ref 0.7–4.0)
MCH: 25.8 pg — ABNORMAL LOW (ref 26.0–34.0)
MCHC: 32.3 g/dL (ref 30.0–36.0)
MCV: 79.8 fL — ABNORMAL LOW (ref 80.0–100.0)
Monocytes Absolute: 0.4 K/uL (ref 0.1–1.0)
Monocytes Relative: 4 %
Neutro Abs: 7.6 K/uL (ref 1.7–7.7)
Neutrophils Relative %: 71 %
Platelets: 243 K/uL (ref 150–400)
RBC: 5.59 MIL/uL — ABNORMAL HIGH (ref 3.87–5.11)
RDW: 14.2 % (ref 11.5–15.5)
WBC: 10.7 K/uL — ABNORMAL HIGH (ref 4.0–10.5)
nRBC: 0 % (ref 0.0–0.2)

## 2023-11-05 LAB — COMPREHENSIVE METABOLIC PANEL WITH GFR
ALT: 22 U/L (ref 0–44)
AST: 19 U/L (ref 15–41)
Albumin: 3.8 g/dL (ref 3.5–5.0)
Alkaline Phosphatase: 57 U/L (ref 38–126)
Anion gap: 11 (ref 5–15)
BUN: 10 mg/dL (ref 6–20)
CO2: 22 mmol/L (ref 22–32)
Calcium: 9.4 mg/dL (ref 8.9–10.3)
Chloride: 108 mmol/L (ref 98–111)
Creatinine, Ser: 0.98 mg/dL (ref 0.44–1.00)
GFR, Estimated: >60 mL/min (ref >60)
Glucose, Bld: 89 mg/dL (ref 70–99)
Potassium: 4 mmol/L (ref 3.5–5.1)
Sodium: 141 mmol/L (ref 135–145)
Total Bilirubin: 0.6 mg/dL (ref 0.0–1.2)
Total Protein: 7.4 g/dL (ref 6.5–8.1)

## 2023-11-05 LAB — URINALYSIS, ROUTINE W REFLEX MICROSCOPIC
Bilirubin Urine: NEGATIVE
Glucose, UA: NEGATIVE mg/dL
Ketones, ur: NEGATIVE mg/dL
Nitrite: POSITIVE — AB
Protein, ur: NEGATIVE mg/dL
Specific Gravity, Urine: 1.012 (ref 1.005–1.030)
pH: 5 (ref 5.0–8.0)

## 2023-11-05 LAB — HCG, SERUM, QUALITATIVE: Preg, Serum: NEGATIVE

## 2023-11-05 LAB — LIPASE, BLOOD: Lipase: 29 U/L (ref 11–51)

## 2023-11-05 MED ORDER — SODIUM CHLORIDE 0.9 % IV SOLN
1.0000 g | Freq: Once | INTRAVENOUS | Status: AC
  Administered 2023-11-05: 1 g via INTRAVENOUS
  Filled 2023-11-05: qty 10

## 2023-11-05 MED ORDER — IOHEXOL 300 MG/ML  SOLN
100.0000 mL | Freq: Once | INTRAMUSCULAR | Status: AC | PRN
  Administered 2023-11-05: 100 mL via INTRAVENOUS

## 2023-11-05 MED ORDER — MORPHINE SULFATE (PF) 4 MG/ML IV SOLN
4.0000 mg | Freq: Once | INTRAVENOUS | Status: AC
  Administered 2023-11-05: 4 mg via INTRAVENOUS
  Filled 2023-11-05: qty 1

## 2023-11-05 MED ORDER — ONDANSETRON HCL 4 MG/2ML IJ SOLN
4.0000 mg | Freq: Once | INTRAMUSCULAR | Status: AC
  Administered 2023-11-05: 4 mg via INTRAVENOUS
  Filled 2023-11-05: qty 2

## 2023-11-05 MED ORDER — ONDANSETRON 4 MG PO TBDP
4.0000 mg | ORAL_TABLET | Freq: Three times a day (TID) | ORAL | 0 refills | Status: AC | PRN
Start: 2023-11-05 — End: ?

## 2023-11-05 MED ORDER — SODIUM CHLORIDE 0.9 % IV BOLUS
1000.0000 mL | Freq: Once | INTRAVENOUS | Status: AC
  Administered 2023-11-05: 1000 mL via INTRAVENOUS

## 2023-11-05 MED ORDER — CEPHALEXIN 500 MG PO CAPS: 500.0000 mg | ORAL_CAPSULE | Freq: Two times a day (BID) | ORAL | 0 refills | Status: AC

## 2023-11-05 NOTE — ED Triage Notes (Signed)
 Pt reports with mid upper left sided sharp abdominal pain and vomiting x 2 days.

## 2023-11-05 NOTE — Discharge Instructions (Signed)
 As we discussed, your workup in the ER today revealed that you have a urinary tract infection.  We have given you 1 round of IV antibiotics for management of this.  I have also given you a prescription for antibiotics you need to fill and take as prescribed its entirety for continued management of your UTI.  I have also given you a prescription for Zofran  you can take as prescribed as needed for any residual nausea or vomiting.  Please call your PCP to schedule a close follow-up appointment at your earliest convenience.  Return if development of any new or worsening symptoms.

## 2023-11-05 NOTE — ED Provider Notes (Signed)
 [place] EMERGENCY DEPARTMENT AT [facility] Provider Note   CSN: [id number] Arrival date & time: 11/05/23  1142     Patient presents with: Abdominal Pain   Jamie Boone is a 28 y.o. female.   Patient with history of hypertension presents today with complaints of abdominal pain, nausea, and vomiting.  Reports her symptoms started 2 days ago and have been persistent since then.  Reports her pain is worse in her upper abdomen and does radiate around into the left side of her back.  Denies any history of similar symptoms previously.  Does endorse history of C-section, no other history of abdominal surgeries.  Denies any fevers or chills.  Denies any urinary symptoms or vaginal discharge.  Denies diarrhea. Denies any vaginal discharge or concern for STD.   The history is provided by the patient. No language interpreter was used.  Abdominal Pain Associated symptoms: nausea and vomiting        Prior to Admission medications   Medication Sig Start Date End Date Taking? Authorizing Provider  amLODipine -valsartan  (EXFORGE ) 10-160 MG tablet Take 1 tablet by mouth daily. 07/31/23   Howell Lunger, DO  diclofenac  Sodium (VOLTAREN ) 1 % GEL Apply 2 g topically 4 (four) times daily as needed (pain). 10/14/23   Majeed, Camie, DO  etonogestrel  (NEXPLANON ) 68 MG IMPL implant 1 each by Subdermal route once. Insertion date 09/13/2021.  Lot number [id number].    [provider]  levonorgestrel  (PLAN B  1-STEP) 1.5 MG tablet TAKE 1 TABLET(1.5 MG) BY MOUTH 1 TIME FOR 1 DOSE 09/22/21   Ganta, Anupa, DO  naproxen  (NAPROSYN ) 500 MG tablet Take 1 tablet (500 mg total) by mouth 2 (two) times daily with a meal. 10/14/23   Majeed, Camie, DO  ondansetron  (ZOFRAN -ODT) 4 MG disintegrating tablet Take 1 tablet (4 mg total) by mouth every 8 (eight) hours as needed for nausea or vomiting. 10/07/23   Howell Lunger, DO  Semaglutide -Weight Management (WEGOVY ) 1.7 MG/0.75ML SOAJ Inject 1.7 mg into the skin once  a week. 10/07/23   Howell Lunger, DO  sertraline  (ZOLOFT ) 100 MG tablet Take 1 tablet (100 mg total) by mouth daily. 09/07/22   Ganta, Anupa, DO    Allergies: Pear and Percocet [oxycodone-acetaminophen]    Review of Systems  Gastrointestinal:  Positive for abdominal pain, nausea and vomiting.  All other systems reviewed and are negative.   Updated Vital Signs BP (!) 129/93   Pulse 69   Temp 98.6 F (37 C) (Oral)   Resp 16   SpO2 99%   Physical Exam Vitals and nursing note reviewed.  Constitutional:      General: She is not in acute distress.    Appearance: Normal appearance. She is obese. She is not ill-appearing, toxic-appearing or diaphoretic.  HENT:     Head: Normocephalic and atraumatic.  Cardiovascular:     Rate and Rhythm: Normal rate.  Pulmonary:     Effort: Pulmonary effort is normal. No respiratory distress.  Abdominal:     General: Abdomen is flat.     Palpations: Abdomen is soft.     Tenderness: There is generalized abdominal tenderness and tenderness in the epigastric area and left upper quadrant. There is no guarding or rebound.  Musculoskeletal:        General: Normal range of motion.     Cervical back: Normal range of motion.  Skin:    General: Skin is warm and dry.  Neurological:     General: No focal deficit present.  Mental Status: She is alert.  Psychiatric:        Mood and Affect: Mood normal.        Behavior: Behavior normal.     (all labs ordered are listed, but only abnormal results are displayed) Labs Reviewed  CBC WITH DIFFERENTIAL/PLATELET - Abnormal; Notable for the following components:      Result Value   WBC 10.7 (*)    RBC 5.59 (*)    MCV 79.8 (*)    MCH 25.8 (*)    All other components within normal limits  URINALYSIS, ROUTINE W REFLEX MICROSCOPIC - Abnormal; Notable for the following components:   Hgb urine dipstick SMALL (*)    Nitrite POSITIVE (*)    Leukocytes,Ua SMALL (*)    Bacteria, UA MANY (*)    All other  components within normal limits  COMPREHENSIVE METABOLIC PANEL WITH GFR  LIPASE, BLOOD  HCG, SERUM, QUALITATIVE    EKG: None  Radiology: CT ABDOMEN PELVIS W CONTRAST Result Date: 11/05/2023 CLINICAL DATA:  Abdominal pain, acute, nonlocalized Mid upper left sided sharp pain and vomiting for 2 days. EXAM: CT ABDOMEN AND PELVIS WITH CONTRAST TECHNIQUE: Multidetector CT imaging of the abdomen and pelvis was performed using the standard protocol following bolus administration of intravenous contrast. RADIATION DOSE REDUCTION: This exam was performed according to the departmental dose-optimization program which includes automated exposure control, adjustment of the mA and/or kV according to patient size and/or use of iterative reconstruction technique. CONTRAST:  100mL OMNIPAQUE  IOHEXOL  300 MG/ML  SOLN COMPARISON:  None available. Report from abdominopelvic CT 02/19/2016. FINDINGS: Lower chest: Mild dependent atelectasis at the left lung base. No confluent airspace disease, pleural or pericardial effusion. Hepatobiliary: Numerous subcentimeter low-density hepatic lesions likely represent cysts or biliary hamartomas in this 28 year old. Gallbladder Phrygian cap noted without evidence of gallstones, gallbladder wall thickening or surrounding inflammation. No biliary dilatation. Pancreas: Unremarkable. No pancreatic ductal dilatation or surrounding inflammatory changes. Spleen: Normal in size without focal abnormality. Adrenals/Urinary Tract: Both adrenal glands appear normal. No evidence of urinary tract calculus, suspicious renal lesion or hydronephrosis. The bladder appears unremarkable for its degree of distention. Stomach/Bowel: No enteric contrast administered. The stomach appears unremarkable for its degree of distension. No evidence of bowel wall thickening, distention or surrounding inflammatory change. The appendix appears normal. Vascular/Lymphatic: There are no enlarged abdominal or pelvic lymph nodes.  No significant vascular findings. Reproductive: The uterus and ovaries appear unremarkable. No adnexal mass. Other: No evidence of abdominal wall mass or hernia. No ascites or pneumoperitoneum. Musculoskeletal: No evidence of acute fracture or aggressive osseous lesion. 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 IMPRESSION: 1. No acute findings or explanation for the patient's symptoms. 2. Numerous subcentimeter low-density hepatic lesions likely represent cysts or biliary hamartomas in this 28 year old. 3. 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 Electronically Signed   By: Elsie Perone M.D.   On: 11/05/2023 18:31     Procedures   Medications Ordered in the ED  cefTRIAXone  (ROCEPHIN ) 1 g in sodium chloride  0.9 % 100 mL IVPB (1 g Intravenous New Bag/Given 11/05/23 1930)  sodium chloride  0.9 % bolus 1,000 mL (0 mLs Intravenous Stopped 11/05/23 1602)  ondansetron  (ZOFRAN ) injection 4 mg (4 mg Intravenous Given 11/05/23 1459)  morphine  (PF) 4 MG/ML injection 4 mg (4 mg Intravenous Given 11/05/23 1500)  iohexol  (OMNIPAQUE ) 300 MG/ML solution 100 mL (100 mLs Intravenous Contrast Given 11/05/23 1813)  Medical Decision Making Amount and/or Complexity of Data Reviewed Labs: ordered. Radiology: ordered.  Risk Prescription drug management.   This patient is a 28 y.o. female who presents to the ED for concern of abdominal pain, nausea, vomiting, this involves an extensive number of treatment options, and is a complaint that carries with it a high risk of complications and morbidity. The emergent differential diagnosis prior to evaluation includes, but is not limited to,   AAA, gastroenteritis, appendicitis, Bowel obstruction, Bowel perforation. Gastroparesis, DKA, Hernia, Inflammatory bowel disease, mesenteric ischemia, pancreatitis, peritonitis SBP, volvulus.  This  is not an exhaustive differential.   Past Medical History / Co-morbidities / Social History:  has a past medical history of Hypertension and Pregnancy induced hypertension.  Additional history: Chart reviewed.  Physical Exam: Physical exam performed. The pertinent findings include: Generalized abdominal TTP without rebound or guarding. No vomiting on exam  Lab Tests: I ordered, and personally interpreted labs.  The pertinent results include:  WBC 10.7, UA with hgb, nitrite +, small leukocytes, 11-20 WBCs, many bacteria concerning for infection   Imaging Studies: I ordered imaging studies including CT abdomen pelvis. I independently visualized and interpreted imaging which showed   1. No acute findings or explanation for the patient's symptoms. 2. Numerous subcentimeter low-density hepatic lesions likely represent cysts or biliary hamartomas in this 28 year old. 3. 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  I agree with the radiologist interpretation.   Medications: I ordered medication including Rocephin  for UTI, morphine , zofran , IV fluids  for abdominal pain, nausea/vomiting. Reevaluation of the patient after these medicines showed that the patient improved. I have reviewed the patients home medicines and have made adjustments as needed.   Disposition: After consideration of the diagnostic results and the patients response to treatment, I feel that emergency department workup does not suggest an emergent condition requiring admission or immediate intervention beyond what has been performed at this time. The plan is: discharge with keflex  for UTI, close outpatient follow-up and return precautions.  After above interventions, patient is feeling better and ready to go home.  She is able to eat and drink without any residual nausea or vomiting.  Will send for Zofran  as needed as well.  Her workup was otherwise benign. Evaluation and diagnostic  testing in the emergency department does not suggest an emergent condition requiring admission or immediate intervention beyond what has been performed at this time.  Plan for discharge with close PCP follow-up.  Patient is understanding and amenable with plan, educated on red flag symptoms that would prompt immediate return.  Patient discharged in stable condition.  Final diagnoses:  Generalized abdominal pain  Acute cystitis with hematuria  Nausea and vomiting, unspecified vomiting type    ED Discharge Orders          Ordered    cephALEXin  (KEFLEX ) 500 MG capsule  2 times daily        11/05/23 2004    ondansetron  (ZOFRAN -ODT) 4 MG disintegrating tablet  Every 8 hours PRN        11/05/23 2004          An After Visit Summary was printed and given to the patient.      Nora Lauraine DELENA DEVONNA 11/05/23 2005    Patt Alm Macho, MD 11/06/23 [phone]

## 2023-11-25 ENCOUNTER — Ambulatory Visit: Admitting: Student

## 2023-11-25 NOTE — Progress Notes (Deleted)
    SUBJECTIVE:   CHIEF COMPLAINT / HPI:   ***  PERTINENT  PMH / PSH: ***  OBJECTIVE:   There were no vitals taken for this visit.  ***  ASSESSMENT/PLAN:   Assessment & Plan      Lavada Porteous, DO [facility]

## 2023-11-29 ENCOUNTER — Ambulatory Visit: Admitting: Student

## 2023-12-11 ENCOUNTER — Telehealth: Payer: Self-pay

## 2023-12-11 NOTE — Telephone Encounter (Signed)
 Pharmacy Patient Advocate Encounter  Rec'd PA request for Wegovy  1.7mg   Effective October 1st, Medicaid will discontinue coverage of GLP1 medications for weight loss (such as Wegovy  and Zepbound), unless the patient has a documented history of a heart attack or stroke. Zepbound will continue to be covered only for patients with moderate to severe sleep apnea (AHI 15-30). Because of this change, the prior authorization team will not be submitting new PA requests for GLP1 medications prescribed for weight loss between now and October 1st, as patients will be unable to continue therapy under Medicaid coverage.

## 2023-12-20 ENCOUNTER — Ambulatory Visit: Admitting: Student

## 2023-12-24 ENCOUNTER — Ambulatory Visit: Admitting: Student

## 2023-12-26 ENCOUNTER — Encounter: Payer: Self-pay | Admitting: Student

## 2023-12-26 ENCOUNTER — Ambulatory Visit
Admission: RE | Admit: 2023-12-26 | Discharge: 2023-12-26 | Disposition: A | Discharge status: Home / Self Care | Source: Ambulatory Visit | Attending: Family Medicine | Admitting: Family Medicine

## 2023-12-26 ENCOUNTER — Ambulatory Visit (INDEPENDENT_AMBULATORY_CARE_PROVIDER_SITE_OTHER): Admitting: Student

## 2023-12-26 VITALS — BP 140/100 | HR 83 | Ht 63.0 in | Wt 294.0 lb

## 2023-12-26 DIAGNOSIS — R29818 Other symptoms and signs involving the nervous system: Secondary | ICD-10-CM | POA: Diagnosis not present

## 2023-12-26 DIAGNOSIS — E66813 Obesity, class 3: Secondary | ICD-10-CM | POA: Diagnosis present

## 2023-12-26 DIAGNOSIS — M545 Low back pain, unspecified: Secondary | ICD-10-CM

## 2023-12-26 DIAGNOSIS — I10 Essential (primary) hypertension: Secondary | ICD-10-CM

## 2023-12-26 DIAGNOSIS — Z6841 Body Mass Index (BMI) 40.0 and over, adult: Secondary | ICD-10-CM

## 2023-12-26 MED ORDER — NALTREXONE HCL (PAIN) 4.5 MG PO CAPS: ORAL_CAPSULE | ORAL | 0 refills | Status: DC

## 2023-12-26 MED ORDER — BUPROPION HCL ER (SR) 100 MG PO TB12: ORAL_TABLET | ORAL | 0 refills | Status: AC

## 2023-12-26 NOTE — Patient Instructions (Addendum)
 It was great to see you! Thank you for allowing me to participate in your care!   I recommend that you always bring your medications to each appointment as this makes it easy to ensure we are on the correct medications and helps us  not miss when refills are needed.  Our plans for today:  - I have sent in bupropion and naltrexone. -Please follow the instructions on the bottles.  You will be slowly increasing the dose every week for 3 weeks - you will receive a call for sleep study - Follow-up in 3 weeks for weight loss discussion  Take care and seek immediate care sooner if you develop any concerns. Please remember to show up 15 minutes before your scheduled appointment time!  Gladis Church, DO [facility]

## 2023-12-26 NOTE — Assessment & Plan Note (Signed)
-   Not at goal today - Recheck 2 weeks - continue Ex forge 10-180 mg daily

## 2023-12-26 NOTE — Progress Notes (Signed)
    SUBJECTIVE:   CHIEF COMPLAINT / HPI:   Discussed the use of AI scribe software for clinical note transcription with the patient, who gave verbal consent to proceed.  History of Present Illness Jamie Boone is a 28 year old female with obesity and hypertension who presents for evaluation of sleep apnea and weight management.  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 Cap Refill <2s. Respiratory: CTAB, normal wob on RA Skin: Warm and dry  ASSESSMENT/PLAN:   Assessment & Plan Class 3 severe obesity due to excess calories with serious comorbidity and body mass index (BMI) of 50.0 to 59.9 in adult [facility]) Doing well with diet and medication adherence. Discussed improving exercise. Weight 4/2: 313 lb Weight 4/16: 311 lb Weight 6/10: 304 lb Weight 7/21: 296 lb Weight 10/9: 294 lb - Continue life-style modifications - Trial Buproprion/Naltrexone for appetite suppression (co-morbid smoking and depression, may have benefit) Suspected sleep apnea - STOP bang 5 - Referral to sleep medicine Hypertension, unspecified type - Not at goal today - Recheck 2 weeks - continue Ex forge 10-180 mg daily    Gladis Church, DO [facility]

## 2023-12-27 ENCOUNTER — Other Ambulatory Visit: Payer: Self-pay

## 2023-12-27 DIAGNOSIS — Z6841 Body Mass Index (BMI) 40.0 and over, adult: Secondary | ICD-10-CM

## 2023-12-27 NOTE — Telephone Encounter (Signed)
 Called pharmacy.   Pharmacist reports they are unable to compound this medication.   She suggests resending the medication to [facility] [place]  [place].   Prescription has been cancelled at [facility].   Will send to PCP to resend to [facility].   I have added this pharmacy to her list.

## 2023-12-30 MED ORDER — NALTREXONE HCL (PAIN) 4.5 MG PO CAPS: ORAL_CAPSULE | ORAL | 0 refills | Status: AC

## 2023-12-30 MED ORDER — NALTREXONE HCL (PAIN) 4.5 MG PO CAPS
ORAL_CAPSULE | ORAL | 0 refills | Status: AC
Start: 2023-12-30 — End: 2024-01-20

## 2024-01-03 ENCOUNTER — Ambulatory Visit: Payer: Self-pay

## 2024-01-06 ENCOUNTER — Ambulatory Visit (INDEPENDENT_AMBULATORY_CARE_PROVIDER_SITE_OTHER): Admitting: Student

## 2024-01-06 VITALS — BP 136/98 | HR 98 | Ht 63.0 in | Wt 298.2 lb

## 2024-01-06 DIAGNOSIS — M5416 Radiculopathy, lumbar region: Secondary | ICD-10-CM | POA: Diagnosis not present

## 2024-01-06 MED ORDER — CYCLOBENZAPRINE HCL 5 MG PO TABS: 5.0000 mg | ORAL_TABLET | Freq: Three times a day (TID) | ORAL | 0 refills | Status: AC | PRN

## 2024-01-06 MED ORDER — MELOXICAM 7.5 MG PO TABS: 7.5000 mg | ORAL_TABLET | Freq: Every day | ORAL | 0 refills | Status: DC

## 2024-01-06 MED ORDER — LORAZEPAM 0.5 MG PO TABS: ORAL_TABLET | ORAL | 0 refills | Status: AC

## 2024-01-06 NOTE — Patient Instructions (Addendum)
 It was great to see you! Thank you for allowing me to participate in your care!   I recommend that you always bring your medications to each appointment as this makes it easy to ensure we are on the correct medications and helps us  not miss when refills are needed.  Our plans for today:  - Use meloxicam 15 mg daily for 14 days.  Do not take ibuprofen or other NSAIDs with medication. - You can take 1000 mg of Tylenol every 6 hours as needed for pain - Please take 1 tablet of Flexeril at night to help with sleep and back spasms - I have sent 1 pill of lorazepam to take 30 minutes before your MRI - Your MRI will be scheduled once it has been approved by insurance - I have sent a referral to physical therapy, you should receive a call in the next couple weeks to schedule  Take care and seek immediate care sooner if you develop any concerns. Please remember to show up 15 minutes before your scheduled appointment time!  Gladis Church, DO [facility]

## 2024-01-06 NOTE — Progress Notes (Signed)
    SUBJECTIVE:   CHIEF COMPLAINT / HPI:   Discussed the use of AI scribe software for clinical note transcription with the patient, who gave verbal consent to proceed.  History of Present Illness Jamie Boone is a 28 year old female who presents with back pain since July.  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 Cap Refill <2s. Respiratory: CTAB, normal wob on RA Back: No respiratory, no ecchymosis, no swelling.  TTP over lumbar spinous process 4-5, and lumbar paraspinal muscles.  5/5 strength, normal sensation.  ASSESSMENT/PLAN:   Assessment & Plan Lumbar back pain with radiculopathy affecting lower extremity L5 bilateral pars fracture on plain film, unclear if congenital/chronic versus acute.  Has radicular symptoms.  Additional differential includes muscle strain, herniated disc. - MRI to assess fracture acuity/debility/nonbony pathology - Tylenol, meloxicam, Flexeril, lidocaine  patch - Need for anxiolytic with MRI, prescribed 1 pill of lorazepam 0.5 mg to take 30 minutes before MRI - Consider referral to spine pending MRI results  Gladis Church, DO [facility] Medicine Center

## 2024-01-10 ENCOUNTER — Ambulatory Visit: Admitting: Student

## 2024-01-22 ENCOUNTER — Telehealth: Payer: Self-pay

## 2024-01-22 NOTE — Telephone Encounter (Signed)
 Rueben with [facility] calls nurse line in regards to MRI order.   She reports on VM the authorization for this test was denied by her insurance.   She reports if the ordering provider is interested in doing a peer to peer to give her a call back.   Will forward to ordering provider.

## 2024-01-23 ENCOUNTER — Other Ambulatory Visit

## 2024-02-03 ENCOUNTER — Other Ambulatory Visit: Payer: Self-pay | Admitting: Student

## 2024-02-03 DIAGNOSIS — M5416 Radiculopathy, lumbar region: Secondary | ICD-10-CM

## 2024-02-03 NOTE — Progress Notes (Signed)
 MRI declined by insurance.  This case was discussed with Dr. Teressa from Sports Medicine. Will send SM referral for continued care and evaluation of MRI necessity.

## 2024-02-05 ENCOUNTER — Ambulatory Visit

## 2024-02-05 VITALS — BP 135/98 | Ht 63.0 in | Wt 290.0 lb

## 2024-02-05 DIAGNOSIS — M5416 Radiculopathy, lumbar region: Secondary | ICD-10-CM

## 2024-02-05 MED ORDER — PREDNISONE 10 MG PO TABS: ORAL_TABLET | ORAL | 0 refills | Status: DC

## 2024-02-05 NOTE — Progress Notes (Signed)
 PCP: Howell Lunger, DO  Subjective:   HPI: Patient is a 28 y.o. female here for low back pain with radiating pain down her right leg.  Patient states that starting in July of this year, she had sudden right lower back pain with radiating pain down her right leg.  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  She was seen by her PCP and attempted conservative management was started including muscle relaxers, Tylenol, anti-inflammatories, lidocaine  patch and therapy.  She has tried all these modalities without any improvement.  Patient states that starting in July she had sudden back pain without injury, trauma, accident, increase or change in activity.  This is on the right side and radiated down the right posterior leg and on the plantar side of her right foot.  She states she has had nearly 0 improvement with conservative measures.  Previous MRI that was ordered was denied due to reported sufficient diagnosis with x-ray.  Patient states that she has continued to have radicular symptoms especially when standing and walking.  She is unable to lay flat on her back.  She denies any alarm symptoms of loss of bowel or bladder function or groin numbness.  Past Medical History:  Diagnosis Date   Hypertension    Pregnancy induced hypertension     Current Outpatient Medications on File Prior to Visit  Medication Sig Dispense Refill   amLODipine -valsartan  (EXFORGE ) 10-160 MG tablet Take 1 tablet by mouth daily. 30 tablet 1   buPROPion  ER (WELLBUTRIN  SR) 100 MG 12 hr tablet Take 1 tablet (100 mg total) by mouth daily for 7 days, THEN 2 tablets (200 mg total) daily for 7 days, THEN 3 tablets (300 mg total) daily for 7 days. 42 tablet 0   cyclobenzaprine  (FLEXERIL ) 5 MG tablet Take 1 tablet (5 mg total) by mouth 3 (three) times daily as needed for muscle spasms. 14 tablet 0   diclofenac  Sodium (VOLTAREN ) 1 % GEL Apply 2 g topically 4 (four)  times daily as needed (pain). 150 g 1   etonogestrel  (NEXPLANON ) 68 MG IMPL implant 1 each by Subdermal route once. Insertion date 09/13/2021.  Lot number [id number].     levonorgestrel  (PLAN B  1-STEP) 1.5 MG tablet TAKE 1 TABLET(1.5 MG) BY MOUTH 1 TIME FOR 1 DOSE 1 tablet 0   LORazepam  (ATIVAN ) 0.5 MG tablet Take 1 tablet 30 minutes before MRI to help with anxiety. Please have someone who can drive you. 1 tablet 0   meloxicam  (MOBIC ) 7.5 MG tablet Take 1 tablet (7.5 mg total) by mouth daily. 14 tablet 0   naproxen  (NAPROSYN ) 500 MG tablet Take 1 tablet (500 mg total) by mouth 2 (two) times daily with a meal. 14 tablet 0   ondansetron  (ZOFRAN -ODT) 4 MG disintegrating tablet Take 1 tablet (4 mg total) by mouth every 8 (eight) hours as needed. 20 tablet 0   Semaglutide -Weight Management (WEGOVY ) 1.7 MG/0.75ML SOAJ Inject 1.7 mg into the skin once a week. 3 mL 1   sertraline  (ZOLOFT ) 100 MG tablet Take 1 tablet (100 mg total) by mouth daily. 30 tablet 3   No current facility-administered medications on file prior to visit.    BP (!) 135/98   Ht 5' 3 (1.6 m)   Wt 290 lb (131.5 kg)   BMI 51.37 kg/m        Objective:   Physical Exam:  Gen: NAD, comfortable in exam room Lumbar spine Inspection: No erythema,  edema or warmth Palpation: Tenderness to palpation in the lumbar midline spinous process region, as well as right paraspinal region ROM: Hip flexion, external and internal rotation does not cause any pain Special Tests: Negative straight leg test bilaterally while sitting Neuro: Patient walks with a guarded and antalgic gait, her strength is 5/5 with resisted hip flexion, knee extension, plantarflexion and dorsiflexion, sensation is reported equal and intact.  DTRs are 2+ at the patellar and Achilles tendons bilaterally  Assessment/Plan:   Jamie Boone is a 28 y.o. female who was seen today for the following: 1. Lumbar radiculopathy (Primary) - MR Lumbar Spine Wo Contrast; Future -  Patient has been attempting conservative therapy without any improvement - She still has radicular symptoms present while standing and walking - With persistent radicular symptoms MRI is needed to rule out further pathology - If surgery was needed, patient states that she would pursue this to resolve her pain - We will attempt a steroid Dosepak to help reduce radicular symptoms and pain - She can continue her anti-inflammatories, muscle relaxers, Tylenol and lidocaine  patch - We will follow-up with her once MRI has been completed  Follow-up/Education:   Return if symptoms worsen or fail to improve.   May return sooner as needed and encouraged to call/e-mail for additional questions or  worsening symptoms in the interim.  [name], DO Sports Medicine Fellow 02/05/2024 10:20 AM

## 2024-02-22 ENCOUNTER — Ambulatory Visit: Admission: RE | Admit: 2024-02-22 | Discharge: 2024-02-22 | Disposition: A | Source: Ambulatory Visit

## 2024-02-22 DIAGNOSIS — M5127 Other intervertebral disc displacement, lumbosacral region: Secondary | ICD-10-CM | POA: Diagnosis not present

## 2024-02-22 DIAGNOSIS — M5416 Radiculopathy, lumbar region: Secondary | ICD-10-CM

## 2024-02-22 DIAGNOSIS — M51361 Other intervertebral disc degeneration, lumbar region with lower extremity pain only: Secondary | ICD-10-CM | POA: Diagnosis not present

## 2024-04-03 ENCOUNTER — Ambulatory Visit: Payer: Self-pay

## 2024-04-15 ENCOUNTER — Telehealth

## 2024-04-15 ENCOUNTER — Ambulatory Visit

## 2024-04-15 DIAGNOSIS — M5416 Radiculopathy, lumbar region: Secondary | ICD-10-CM

## 2024-04-15 MED ORDER — METHYLPREDNISOLONE 4 MG PO TBPK: ORAL_TABLET | ORAL | 0 refills | Status: AC

## 2024-04-15 MED ORDER — MELOXICAM 15 MG PO TABS: 15.0000 mg | ORAL_TABLET | Freq: Every day | ORAL | 2 refills | Status: AC

## 2024-04-15 NOTE — Progress Notes (Signed)
 Virtual Visit via Video Note  I connected with Jamie Boone on 04/15/24 at 11:15 AM EST by a video enabled telemedicine application and verified that I am speaking with the correct person using two identifiers.  Location: Patient: Home Provider: [facility]    I discussed the limitations of evaluation and management by telemedicine and the availability of in person appointments. The patient expressed understanding and agreed to proceed.  History of Present Illness: - Patient is following up for back pain and R sided radiculopathy.  Patient had MRI due to back pain with right sided radiculopathy.  Patient was prescribed a steroid dosepak at her last visit but never picked up the medication.  She has not been taking an NSAID at this time.  She has done some therapy with consistent and unrelenting symptoms.      Observations/Objective: Patient is seated and in no acute distress   MRI reviewed with patient - L5-S1 mild-moderate right foraminal stenosis   Assessment and Plan: - R sided lumbar radiculopathy with continued symptoms  - Discussed potential injection therapy with patient  - She was not interested in injections into her back  - Discussed that she can do a trial of oral steroids and continue therapy  - She was agreeable to this  - After steroids are complete, she can start mobic  15mg  - She can follow up as needed and the injections are available as a treatment option in the future   Follow Up Instructions:    I discussed the assessment and treatment plan with the patient. The patient was provided an opportunity to ask questions and all were answered. The patient agreed with the plan and demonstrated an understanding of the instructions.   The patient was advised to call back or seek an in-person evaluation if the symptoms worsen or if the condition fails to improve as anticipated.  I provided 20 minutes of non-face-to-face time during this  encounter.   Krystal CHRISTELLA Lowing, DO

## 2024-04-23 ENCOUNTER — Telehealth: Payer: Self-pay

## 2024-04-23 NOTE — Telephone Encounter (Signed)
 Pharmacy Patient Advocate Encounter   Received notification from [facility] that prior authorization for WEGOVY  1.7MG  is required/requested.   Insurance verification completed.   The patient is insured through [employer].   Patient needs an appointment for weight loss documentation.  Archived Key [id number]

## 2024-04-27 ENCOUNTER — Ambulatory Visit: Payer: Self-pay | Admitting: Student
